# Patient Record
Sex: Female | Born: 1960 | Race: White | Hispanic: No | Marital: Married | Smoking: Never smoker
Health system: Southern US, Community
[De-identification: ages and names within clinical notes are randomized; demographics above are authoritative.]

## PROBLEM LIST (undated history)

## (undated) DIAGNOSIS — E785 Hyperlipidemia, unspecified: Secondary | ICD-10-CM

## (undated) HISTORY — DX: Hyperlipidemia, unspecified: E78.5

---

## 1988-03-19 HISTORY — PX: TUMOR REMOVAL: SHX12

## 1998-06-24 ENCOUNTER — Other Ambulatory Visit: Admission: RE | Admit: 1998-06-24 | Discharge: 1998-06-24 | Payer: Self-pay | Admitting: *Deleted

## 1999-11-07 ENCOUNTER — Other Ambulatory Visit: Admission: RE | Admit: 1999-11-07 | Discharge: 1999-11-07 | Payer: Self-pay | Admitting: *Deleted

## 2005-09-25 ENCOUNTER — Encounter: Payer: Self-pay | Admitting: Internal Medicine

## 2005-10-02 ENCOUNTER — Encounter: Payer: Self-pay | Admitting: Internal Medicine

## 2005-10-11 ENCOUNTER — Encounter: Payer: Self-pay | Admitting: Internal Medicine

## 2007-04-30 ENCOUNTER — Encounter: Admission: RE | Admit: 2007-04-30 | Discharge: 2007-04-30 | Payer: Self-pay | Admitting: Obstetrics and Gynecology

## 2007-05-20 ENCOUNTER — Other Ambulatory Visit: Admission: RE | Admit: 2007-05-20 | Discharge: 2007-05-20 | Payer: Self-pay | Admitting: Interventional Radiology

## 2007-05-20 ENCOUNTER — Encounter: Admission: RE | Admit: 2007-05-20 | Discharge: 2007-05-20 | Payer: Self-pay | Admitting: Obstetrics and Gynecology

## 2007-05-20 ENCOUNTER — Encounter (INDEPENDENT_AMBULATORY_CARE_PROVIDER_SITE_OTHER): Payer: Self-pay | Admitting: Interventional Radiology

## 2007-08-20 ENCOUNTER — Ambulatory Visit: Payer: Self-pay | Admitting: Internal Medicine

## 2007-08-20 DIAGNOSIS — E041 Nontoxic single thyroid nodule: Secondary | ICD-10-CM

## 2007-08-20 DIAGNOSIS — E785 Hyperlipidemia, unspecified: Secondary | ICD-10-CM

## 2007-12-18 LAB — CONVERTED CEMR LAB: Pap Smear: NORMAL

## 2008-02-13 ENCOUNTER — Ambulatory Visit: Payer: Self-pay | Admitting: Internal Medicine

## 2008-02-13 LAB — CONVERTED CEMR LAB
AST: 23 units/L (ref 0–37)
Alkaline Phosphatase: 47 units/L (ref 39–117)
Bilirubin Urine: NEGATIVE
Bilirubin, Direct: 0.1 mg/dL (ref 0.0–0.3)
Chloride: 108 meq/L (ref 96–112)
Cholesterol: 232 mg/dL (ref 0–200)
Eosinophils Absolute: 0 10*3/uL (ref 0.0–0.7)
Eosinophils Relative: 0.9 % (ref 0.0–5.0)
GFR calc Af Amer: 98 mL/min
GFR calc non Af Amer: 81 mL/min
Glucose, Bld: 74 mg/dL (ref 70–99)
HCT: 45.2 % (ref 36.0–46.0)
HDL: 78.7 mg/dL (ref >39.0)
Monocytes Absolute: 0.4 10*3/uL (ref 0.1–1.0)
Monocytes Relative: 9.2 % (ref 3.0–12.0)
Neutrophils Relative %: 56 % (ref 43.0–77.0)
Nitrite: NEGATIVE
Platelets: 209 10*3/uL (ref 150–400)
Potassium: 4.5 meq/L (ref 3.5–5.1)
Protein, U semiquant: NEGATIVE
RDW: 12.1 % (ref 11.5–14.6)
Sodium: 144 meq/L (ref 135–145)
Total CHOL/HDL Ratio: 2.9
Triglycerides: 81 mg/dL (ref 0–149)
Urobilinogen, UA: 0.2
WBC: 4.3 10*3/uL — ABNORMAL LOW (ref 4.5–10.5)

## 2008-02-19 ENCOUNTER — Ambulatory Visit: Payer: Self-pay | Admitting: Internal Medicine

## 2008-03-23 ENCOUNTER — Ambulatory Visit: Payer: Self-pay | Admitting: Family Medicine

## 2008-04-28 ENCOUNTER — Telehealth: Payer: Self-pay | Admitting: Internal Medicine

## 2008-05-05 ENCOUNTER — Ambulatory Visit: Payer: Self-pay | Admitting: Gastroenterology

## 2008-05-27 ENCOUNTER — Ambulatory Visit: Payer: Self-pay | Admitting: Gastroenterology

## 2008-08-17 ENCOUNTER — Encounter: Payer: Self-pay | Admitting: Internal Medicine

## 2008-08-25 ENCOUNTER — Encounter: Admission: RE | Admit: 2008-08-25 | Discharge: 2008-08-25 | Payer: Self-pay | Admitting: Endocrinology

## 2008-11-15 ENCOUNTER — Telehealth (INDEPENDENT_AMBULATORY_CARE_PROVIDER_SITE_OTHER): Payer: Self-pay | Admitting: *Deleted

## 2009-01-18 ENCOUNTER — Ambulatory Visit: Payer: Self-pay | Admitting: Family Medicine

## 2009-01-19 ENCOUNTER — Telehealth: Payer: Self-pay | Admitting: Family Medicine

## 2009-07-17 LAB — CONVERTED CEMR LAB: Pap Smear: NORMAL

## 2009-08-05 ENCOUNTER — Ambulatory Visit: Payer: Self-pay | Admitting: Internal Medicine

## 2009-08-05 LAB — CONVERTED CEMR LAB
AST: 19 units/L (ref 0–37)
Albumin: 4.3 g/dL (ref 3.5–5.2)
Basophils Absolute: 0 10*3/uL (ref 0.0–0.1)
Basophils Relative: 0.3 % (ref 0.0–3.0)
CO2: 29 meq/L (ref 19–32)
Cholesterol: 224 mg/dL — ABNORMAL HIGH (ref 0–200)
Eosinophils Absolute: 0 10*3/uL (ref 0.0–0.7)
Glucose, Bld: 81 mg/dL (ref 70–99)
HCT: 43.7 % (ref 36.0–46.0)
HDL: 75.5 mg/dL (ref >39.00)
Hemoglobin: 14.9 g/dL (ref 12.0–15.0)
Lymphs Abs: 1.4 10*3/uL (ref 0.7–4.0)
MCHC: 34.2 g/dL (ref 30.0–36.0)
Neutro Abs: 4.6 10*3/uL (ref 1.4–7.7)
RBC: 4.73 M/uL (ref 3.87–5.11)
RDW: 13.4 % (ref 11.5–14.6)
TSH: 1.17 microintl units/mL (ref 0.35–5.50)
Total CHOL/HDL Ratio: 3
Total Protein: 6.5 g/dL (ref 6.0–8.3)
Triglycerides: 117 mg/dL (ref 0.0–149.0)
VLDL: 23.4 mg/dL (ref 0.0–40.0)

## 2009-08-17 ENCOUNTER — Encounter: Payer: Self-pay | Admitting: Internal Medicine

## 2009-08-23 ENCOUNTER — Ambulatory Visit: Payer: Self-pay | Admitting: Internal Medicine

## 2009-08-25 ENCOUNTER — Ambulatory Visit: Payer: Self-pay | Admitting: Internal Medicine

## 2009-08-26 ENCOUNTER — Encounter: Payer: Self-pay | Admitting: Internal Medicine

## 2009-08-26 DIAGNOSIS — R319 Hematuria, unspecified: Secondary | ICD-10-CM | POA: Insufficient documentation

## 2009-08-26 LAB — CONVERTED CEMR LAB
Glucose, Urine, Semiquant: NEGATIVE
Nitrite: NEGATIVE
Protein, U semiquant: NEGATIVE
WBC Urine, dipstick: NEGATIVE
pH: 5.5

## 2009-08-29 ENCOUNTER — Ambulatory Visit: Payer: Self-pay | Admitting: Cardiovascular Disease

## 2009-09-29 ENCOUNTER — Encounter: Payer: Self-pay | Admitting: Internal Medicine

## 2009-11-30 ENCOUNTER — Ambulatory Visit (HOSPITAL_COMMUNITY): Admission: RE | Admit: 2009-11-30 | Discharge: 2009-11-30 | Payer: Self-pay | Admitting: Obstetrics and Gynecology

## 2010-01-24 ENCOUNTER — Encounter: Admission: RE | Admit: 2010-01-24 | Discharge: 2010-01-24 | Payer: Self-pay | Admitting: Endocrinology

## 2010-03-19 LAB — HM PAP SMEAR: HM Pap smear: NORMAL

## 2010-04-20 NOTE — Assessment & Plan Note (Signed)
Summary: cpx/cjr/pt rescd from bump//ccm   Vital Signs:  Patient profile:   50 year old female Menstrual status:  regular LMP:     08/05/2009 Height:      65.5 inches Weight:      169 pounds BMI:     27.80 Pulse rate:   64 / minute Pulse rhythm:   regular Resp:     12 per minute BP sitting:   122 / 74  (left arm) Cuff size:   regular  Vitals Entered By: Gladis Riffle, RN (August 23, 2009 9:12 AM) CC: cpx, labs done has gyn Is Patient Diabetic? No LMP (date): 08/05/2009     Menstrual Status regular Enter LMP: 08/05/2009 Last PAP Result normal   CC:  cpx and labs done has gyn.  History of Present Illness: cpx  Preventive Screening-Counseling & Management  Alcohol-Tobacco     Smoking Status: never  Current Problems (verified): 1)  Preventive Health Care  (ICD-V70.0) 2)  Thyroid Nodule  (ICD-241.0) 3)  Hyperlipidemia  (ICD-272.4)  Current Medications (verified): 1)  Multivitamins   Tabs (Multiple Vitamin) .... Once Daily 2)  Fish Oil 1000 Mg  Caps (Omega-3 Fatty Acids) .... Once Daily 3)  Caltrate 600+d 600-400 Mg-Unit  Tabs (Calcium Carbonate-Vitamin D) .... Two Times A Day 4)  Aspirin 81 Mg  Tbec (Aspirin) .... Once Daily  Allergies: 1)  ! 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   Head:  normocephalic and atraumatic.   Eyes:  pupils equal and pupils round.   Ears:  R ear normal and L ear normal.   Nose:  no external deformity and no external erythema.   Neck:  No deformities, masses, or tenderness noted. Chest Wall:  No deformities, masses, or tenderness noted. Lungs:  normal respiratory effort and no intercostal retractions.   Heart:  normal rate and regular rhythm.   Abdomen:  Bowel sounds positive,abdomen soft and non-tender without masses, organomegaly or hernias noted. Msk:  No deformity or scoliosis noted of thoracic or lumbar spine.   Neurologic:  cranial nerves II-XII intact and gait normal.   Skin:  Intact without suspicious lesions or rashes Cervical Nodes:  no anterior cervical adenopathy and no posterior cervical adenopathy.   Psych:  memory intact for recent and remote and good eye contact.     Impression & Recommendations:  Problem # 1:  PREVENTIVE HEALTH CARE (ICD-V70.0) Health Maint utd advised exercise/ modest weight loss low fat/low calorie diet  Problem # 2:  HYPERLIPIDEMIA (ICD-272.4) NOTE hdl no treatment necessary  Complete Medication List: 1)  Multivitamins Tabs (Multiple vitamin) .... Once daily 2)  Fish Oil 1000 Mg Caps (Omega-3 fatty acids) .... Once daily 3)  Caltrate 600+d 600-400 Mg-unit Tabs (Calcium carbonate-vitamin d) .... Two times a day 4)  Aspirin 81 Mg Tbec (Aspirin) .... Once daily  Preventive Care Screening  Mammogram:    Date:  08/17/2009    Next Due:  08/2011    Results:  normal-pts report   Pap Smear:    Date:  07/17/2009    Next Due:  07/2012    Results:  normal   Appended Document: cpx/cjr/pt rescd from bump//ccm  Laboratory Results   Urine Tests    Routine Urinalysis   Color: yellow Appearance: Clear Glucose: negative   (Normal Range: Negative) Bilirubin: negative   (Normal Range: Negative) Ketone: negative   (Normal Range: Negative) Spec. Gravity: >=1.030   (Normal  Range: 1.003-1.035) Blood: 1+   (Normal Range: Negative) pH: 5.5   (Normal Range: 5.0-8.0) Protein: negative   (Normal Range: Negative) Urobilinogen: 0.2   (Normal Range: 0-1) Nitrite: negative   (Normal Range: Negative) Leukocyte Esterace: negative   (Normal Range: Negative)    Comments: Rita Ohara  August 23, 2009 11:35 AM      Appended Document: cpx/cjr/pt rescd from bump//ccm call patient. minimal amount of blood in urine repeat UA with culture  Appended Document: cpx/cjr/pt rescd from bump//ccm Patient notified. Appt made for 08/25/09

## 2010-04-20 NOTE — Consult Note (Signed)
Summary: [facility]  [facility]   Imported By: Maryln Gottron 10/07/2009 13:15:25  _____________________________________________________________________  External Attachment:    Type:   Image     Comment:   External Document

## 2010-04-20 NOTE — Miscellaneous (Signed)
Summary: Orders Update  Clinical Lists Changes  Problems: Added new problem of HEMATURIA UNSPECIFIED (ICD-599.70) Orders: Added new Referral order of Radiology Referral (Radiology) - Signed

## 2010-05-19 NOTE — Op Note (Signed)
  [name], Brandy Proctor               ACCOUNT NO.:  [id number]  MEDICAL RECORD NO.:  [id number]          PATIENT TYPE:  AMB  LOCATION:  SDC                           FACILITY:  [facility]  PHYSICIAN:  Sherry A. Dickstein, M.D.DATE OF BIRTH:  [birth year]  DATE OF PROCEDURE: DATE OF DISCHARGE:                              OPERATIVE REPORT  PREOPERATIVE DIAGNOSES:  Menorrhagia, submucosal fibroids.  POSTOPERATIVE DIAGNOSES:  Menorrhagia, submucosal fibroids.  PROCEDURES:  Dilation and curettage, hysteroscopy with MyoSure resection.  SURGEON:  Sherry A. Rosalio Macadamia, MD  ANESTHESIA:  General.  INDICATIONS:  This is a 50 year old, G4, P3-0-1-3 woman whose menstrual cycles every 28 days lasting 5-6 days with excessively heavy flow. Because of this menorrhagia, the patient was evaluated first using ultrasound and then with a sonohysterogram.  The sonohysterogram revealed a submucosal fibroid approximately 2 x 2 cm with a possible second fibroid as well.  Because of the submucosal fibroid, the patient is brought to the operating room for D and C, hysteroscopy with MyoSure resection.  FINDINGS:  Normal sized anteflexed uterus with submucosal fibroid present.  PROCEDURE:  The patient was brought into the operating room and given adequate general anesthesia.  She was placed in dorsal lithotomy position.  Her perineum was washed with Betadine and vagina was washed with Betadine.  Pelvic examination was performed.  Surgeon's gown and gloves were changed.  The patient was draped in sterile fashion. Paracervical block was administered with 1% Nesacaine.  Single-toothed tenaculum was placed on the anterior lip of the cervix using Pitressin that was diluted 20 mL in 50 mL of saline.  Approximately, 10 mL of Pitressin was infiltrated directly into the cervix.  The cervix was then dilated using Pratt dilators up to a #21.  The hysteroscope was introduced into the endometrial cavity and pictures were  obtained.  A submucosal fibroid was visualized after both corners were visualized. Using the MyoSure resector, the submucosal fibroid was dissected in its entirety.  Once adequate hemostasis was present, pictures were obtained before and after this procedure.  All instruments were removed from the vagina.  Adequate hemostasis was present.  The patient was taken out of the dorsal lithotomy position.  The LMA breathing tube was removed.  She was moved from the operating table to a stretcher in stable condition. Complications were none.  Estimated blood loss approximately 10 mL.  The specimen was sent to Pathology, this approximately 345 saline differential.     Cordelia Pen A. Rosalio Macadamia, M.D.    SAD/MEDQ  D:  11/30/2009  T:  12/01/2009  Job:  [id number]  Electronically Signed by Floyde Parkins M.D. on 05/19/2010 12:32:34 PM

## 2010-06-01 LAB — CBC
HCT: 45.5 % (ref 36.0–46.0)
Hemoglobin: 15.3 g/dL — ABNORMAL HIGH (ref 12.0–15.0)
MCH: 30.8 pg (ref 26.0–34.0)
MCHC: 33.6 g/dL (ref 30.0–36.0)
RDW: 13.3 % (ref 11.5–15.5)

## 2010-08-28 ENCOUNTER — Encounter: Payer: Self-pay | Admitting: Internal Medicine

## 2010-08-29 ENCOUNTER — Other Ambulatory Visit (INDEPENDENT_AMBULATORY_CARE_PROVIDER_SITE_OTHER): Payer: BLUE CROSS/BLUE SHIELD

## 2010-08-29 DIAGNOSIS — Z0000 Encounter for general adult medical examination without abnormal findings: Secondary | ICD-10-CM

## 2010-08-29 LAB — BASIC METABOLIC PANEL
BUN: 16 mg/dL (ref 6–23)
Calcium: 9.5 mg/dL (ref 8.4–10.5)
Creatinine, Ser: 0.7 mg/dL (ref 0.4–1.2)
GFR: 88.87 mL/min (ref >60.00)
Glucose, Bld: 89 mg/dL (ref 70–99)
Potassium: 5 mEq/L (ref 3.5–5.1)

## 2010-08-29 LAB — CBC WITH DIFFERENTIAL/PLATELET
Basophils Absolute: 0 10*3/uL (ref 0.0–0.1)
Eosinophils Relative: 0.4 % (ref 0.0–5.0)
HCT: 44.7 % (ref 36.0–46.0)
Lymphocytes Relative: 25.7 % (ref 12.0–46.0)
Lymphs Abs: 1.4 10*3/uL (ref 0.7–4.0)
Monocytes Relative: 8.1 % (ref 3.0–12.0)
Neutrophils Relative %: 65.5 % (ref 43.0–77.0)
Platelets: 224 10*3/uL (ref 150.0–400.0)
RDW: 13.4 % (ref 11.5–14.6)
WBC: 5.6 10*3/uL (ref 4.5–10.5)

## 2010-08-29 LAB — POCT URINALYSIS DIPSTICK
Bilirubin, UA: NEGATIVE
Glucose, UA: NEGATIVE
Leukocytes, UA: NEGATIVE
Nitrite, UA: NEGATIVE
Urobilinogen, UA: 0.2
pH, UA: 5

## 2010-08-29 LAB — LIPID PANEL
Cholesterol: 249 mg/dL — ABNORMAL HIGH (ref 0–200)
Total CHOL/HDL Ratio: 4
VLDL: 18 mg/dL (ref 0.0–40.0)

## 2010-08-29 LAB — HEPATIC FUNCTION PANEL
ALT: 10 U/L (ref 0–35)
AST: 17 U/L (ref 0–37)
Bilirubin, Direct: 0.1 mg/dL (ref 0.0–0.3)
Total Bilirubin: 0.4 mg/dL (ref 0.3–1.2)

## 2010-08-29 LAB — LDL CHOLESTEROL, DIRECT: Direct LDL: 156.3 mg/dL

## 2010-08-30 ENCOUNTER — Encounter: Payer: Self-pay | Admitting: Internal Medicine

## 2010-08-31 ENCOUNTER — Telehealth: Payer: Self-pay | Admitting: Internal Medicine

## 2010-09-04 ENCOUNTER — Encounter: Payer: Self-pay | Admitting: Internal Medicine

## 2010-09-04 ENCOUNTER — Ambulatory Visit (INDEPENDENT_AMBULATORY_CARE_PROVIDER_SITE_OTHER): Payer: BLUE CROSS/BLUE SHIELD | Admitting: Internal Medicine

## 2010-09-04 DIAGNOSIS — E041 Nontoxic single thyroid nodule: Secondary | ICD-10-CM

## 2010-09-04 NOTE — Progress Notes (Signed)
  Subjective:    Patient ID: Brandy Proctor, female    DOB: 1960/12/13, 50 y.o.   MRN: [id number]  HPI  cpx Past Medical History  Diagnosis Date  . Hyperlipidemia    Past Surgical History  Procedure Date  . Cesarean section     x3  . 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 She does not have any smokeless tobacco history on file. Her alcohol and drug histories not on file. family history includes Heart disease in her brother and father and Hyperlipidemia in her father and mother. Allergies  Allergen Reactions  . Penicillins     REACTION: rash     Review of Systems  patient denies chest pain, shortness of breath, orthopnea. Denies lower extremity edema, abdominal pain, change in appetite, change in bowel movements. Patient denies rashes, musculoskeletal complaints. No other specific complaints in a complete review of systems.      Objective:   Physical Exam  Well-developed well-nourished female in no acute distress. HEENT exam atraumatic, normocephalic, extraocular muscles are intact. Neck is supple. No jugular venous distention no thyromegaly. Chest clear to auscultation without increased work of breathing. Cardiac exam S1 and S2 are regular. Abdominal exam active bowel sounds, soft, nontender. Extremities no edema. Neurologic exam she is alert without any motor sensory deficits. Gait is normal.      Assessment & Plan:  Well visit---health maint UTD Recommended modest weight loss and daily exercise

## 2010-09-04 NOTE — Assessment & Plan Note (Signed)
Started levothyroxine for enlarging thyroid nodule

## 2010-09-15 ENCOUNTER — Encounter: Payer: Self-pay | Admitting: Internal Medicine

## 2010-11-30 ENCOUNTER — Other Ambulatory Visit: Payer: Self-pay | Admitting: Endocrinology

## 2010-11-30 DIAGNOSIS — E049 Nontoxic goiter, unspecified: Secondary | ICD-10-CM

## 2010-12-12 ENCOUNTER — Ambulatory Visit
Admission: RE | Admit: 2010-12-12 | Discharge: 2010-12-12 | Disposition: A | Discharge status: Home / Self Care | Payer: BLUE CROSS/BLUE SHIELD | Source: Ambulatory Visit | Attending: Endocrinology | Admitting: Endocrinology

## 2010-12-12 DIAGNOSIS — E049 Nontoxic goiter, unspecified: Secondary | ICD-10-CM

## 2010-12-22 NOTE — Telephone Encounter (Signed)
Chart opened in error

## 2011-01-04 ENCOUNTER — Encounter: Payer: Self-pay | Admitting: Internal Medicine

## 2011-01-04 ENCOUNTER — Ambulatory Visit (INDEPENDENT_AMBULATORY_CARE_PROVIDER_SITE_OTHER): Payer: BC Managed Care – PPO | Admitting: Internal Medicine

## 2011-01-04 VITALS — BP 122/84 | Temp 98.5°F | Wt 165.0 lb

## 2011-01-04 DIAGNOSIS — M25569 Pain in unspecified knee: Secondary | ICD-10-CM

## 2011-01-07 NOTE — Progress Notes (Signed)
  Subjective:    Patient ID: Brandy Proctor, female    DOB: Feb 14, 1961, 50 y.o.   MRN: [id number]  HPI  Patient comes in with right knee pain. This is been ongoing for one to 2 weeks. She notes some swelling behind her right knee. This seems to be intermittent. She denies any erythema around the knee. No fevers or chills. She denies any acute trauma.  Past Medical History  Diagnosis Date  . Hyperlipidemia      Review of Systems  patient denies chest pain, shortness of breath, orthopnea. Denies lower extremity edema, abdominal pain, change in appetite, change in bowel movements. Patient denies rashes, musculoskeletal complaints. No other specific complaints in a complete review of systems.      Objective:   Physical Exam Well-developed female. Chest clear to auscultation cardiac exam S1-S2 are regular. She has full range of motion of both knees. She may have a very small joint effusion on the right with a positive bulge sign.    Assessment & Plan:  Right knee pain. Unclear etiology. We'll check an x-ray. We'll make decisions after that. I've asked her to ice the area after prolonged walking.

## 2011-01-08 ENCOUNTER — Ambulatory Visit (INDEPENDENT_AMBULATORY_CARE_PROVIDER_SITE_OTHER)
Admission: RE | Admit: 2011-01-08 | Discharge: 2011-01-08 | Disposition: A | Discharge status: Home / Self Care | Payer: BC Managed Care – PPO | Source: Ambulatory Visit | Attending: Internal Medicine | Admitting: Internal Medicine

## 2011-01-08 DIAGNOSIS — M25569 Pain in unspecified knee: Secondary | ICD-10-CM

## 2011-01-09 ENCOUNTER — Other Ambulatory Visit: Payer: Self-pay | Admitting: Internal Medicine

## 2011-01-09 DIAGNOSIS — M25561 Pain in right knee: Secondary | ICD-10-CM

## 2011-03-20 LAB — HM PAP SMEAR: HM Pap smear: NORMAL

## 2011-09-25 ENCOUNTER — Other Ambulatory Visit: Payer: BC Managed Care – PPO

## 2011-10-04 ENCOUNTER — Encounter: Payer: BC Managed Care – PPO | Admitting: Internal Medicine

## 2011-10-04 ENCOUNTER — Other Ambulatory Visit: Payer: Self-pay | Admitting: Obstetrics

## 2011-10-04 DIAGNOSIS — Z1231 Encounter for screening mammogram for malignant neoplasm of breast: Secondary | ICD-10-CM

## 2011-10-11 ENCOUNTER — Encounter: Payer: Self-pay | Admitting: Internal Medicine

## 2011-10-11 ENCOUNTER — Ambulatory Visit (INDEPENDENT_AMBULATORY_CARE_PROVIDER_SITE_OTHER): Payer: BC Managed Care – PPO | Admitting: Internal Medicine

## 2011-10-11 ENCOUNTER — Other Ambulatory Visit: Payer: BC Managed Care – PPO

## 2011-10-11 VITALS — BP 134/90 | HR 72 | Temp 98.6°F | Ht 65.5 in | Wt 171.0 lb

## 2011-10-11 DIAGNOSIS — Z0000 Encounter for general adult medical examination without abnormal findings: Secondary | ICD-10-CM

## 2011-10-11 LAB — CBC WITH DIFFERENTIAL/PLATELET
Basophils Absolute: 0 10*3/uL (ref 0.0–0.1)
Eosinophils Absolute: 0 10*3/uL (ref 0.0–0.7)
HCT: 44.7 % (ref 36.0–46.0)
Hemoglobin: 15.1 g/dL — ABNORMAL HIGH (ref 12.0–15.0)
Lymphs Abs: 1.2 10*3/uL (ref 0.7–4.0)
MCHC: 33.8 g/dL (ref 30.0–36.0)
Monocytes Absolute: 0.4 10*3/uL (ref 0.1–1.0)
Neutro Abs: 2.2 10*3/uL (ref 1.4–7.7)
Platelets: 227 10*3/uL (ref 150.0–400.0)
RDW: 12.9 % (ref 11.5–14.6)

## 2011-10-11 LAB — HEPATIC FUNCTION PANEL
ALT: 16 U/L (ref 0–35)
AST: 27 U/L (ref 0–37)
Albumin: 4.4 g/dL (ref 3.5–5.2)
Total Bilirubin: 0.6 mg/dL (ref 0.3–1.2)

## 2011-10-11 LAB — BASIC METABOLIC PANEL
CO2: 28 mEq/L (ref 19–32)
Calcium: 9.4 mg/dL (ref 8.4–10.5)
Creatinine, Ser: 0.8 mg/dL (ref 0.4–1.2)
GFR: 83.21 mL/min (ref >60.00)
Sodium: 138 mEq/L (ref 135–145)

## 2011-10-11 LAB — LIPID PANEL
Cholesterol: 275 mg/dL — ABNORMAL HIGH (ref 0–200)
Total CHOL/HDL Ratio: 4
Triglycerides: 97 mg/dL (ref 0.0–149.0)

## 2011-10-11 LAB — POCT URINALYSIS DIPSTICK
Bilirubin, UA: NEGATIVE
Leukocytes, UA: NEGATIVE
Nitrite, UA: NEGATIVE
Protein, UA: NEGATIVE
Urobilinogen, UA: 0.2
pH, UA: 6

## 2011-10-11 NOTE — Progress Notes (Signed)
Patient ID: Brandy Proctor, female   DOB: [birth year], 51 y.o.   MRN: [id number]  CPX  Past Medical History  Diagnosis Date  . Hyperlipidemia     History   Social History  . Marital Status: Widowed    Spouse Name: N/A    Number of Children: N/A  . Years of Education: N/A   Occupational History  . Not on file.   Social History Main Topics  . Smoking status: Never Smoker   . Smokeless tobacco: Not on file  . Alcohol Use:   . Drug Use:   . Sexually Active:    Other Topics Concern  . Not on file   Social History Narrative  . No narrative on file    Past Surgical History  Procedure Date  . Cesarean section     x3  . 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 Hyperlipidemia Mother   . Heart disease Father     CABG  . Hyperlipidemia Father   . Heart disease Brother     Allergies  Allergen Reactions  . Penicillins     REACTION: rash    Current Outpatient Prescriptions on File Prior to Visit  Medication Sig Dispense Refill  . aspirin 81 MG tablet Take 81 mg by mouth daily.        . Calcium Carbonate-Vit D-Min (CALTRATE 600+D PLUS) 600-400 MG-UNIT per tablet Take 1 tablet by mouth daily.        . fish oil-omega-3 fatty acids 1000 MG capsule Take 2 g by mouth daily.        . folic acid (FOLVITE) 1 MG tablet Take 1 tablet by mouth daily.      Marland Kitchen levothyroxine (SYNTHROID, LEVOTHROID) 25 MCG tablet Take 1 tablet by mouth daily.      . Multiple Vitamin (MULTIVITAMIN) tablet Take 1 tablet by mouth daily.           patient denies chest pain, shortness of breath, orthopnea. Denies lower extremity edema, abdominal pain, change in appetite, change in bowel movements. Patient denies rashes, musculoskeletal complaints. No other specific complaints in a complete review of systems.   BP 134/90  Pulse 72  Temp 98.6 F (37 C) (Oral)  Ht 5' 5.5" (1.664 m)  Wt 171 lb (77.565 kg)  BMI 28.02 kg/m2  Well-developed  well-nourished female in no acute distress. HEENT exam atraumatic, normocephalic, extraocular muscles are intact. Neck is supple. No jugular venous distention no thyromegaly. Chest clear to auscultation without increased work of breathing. Cardiac exam S1 and S2 are regular. Abdominal exam active bowel sounds, soft, nontender. Extremities no edema. Neurologic exam she is alert without any motor sensory deficits. Gait is normal.   Well visit- health maint utd-- advised aggressive weight loss

## 2011-10-12 ENCOUNTER — Other Ambulatory Visit: Payer: Self-pay | Admitting: Internal Medicine

## 2011-10-12 DIAGNOSIS — E7800 Pure hypercholesterolemia, unspecified: Secondary | ICD-10-CM

## 2011-10-12 NOTE — Progress Notes (Signed)
Quick Note:  Spoke with Brandy Proctor- informed of lab and dr. Marliss Coots instructions - future order done and transferred to scheduling for lab appt - fasting ______

## 2011-10-17 ENCOUNTER — Encounter: Payer: BC Managed Care – PPO | Admitting: Internal Medicine

## 2011-10-17 ENCOUNTER — Ambulatory Visit
Admission: RE | Admit: 2011-10-17 | Discharge: 2011-10-17 | Disposition: A | Discharge status: Home / Self Care | Payer: BC Managed Care – PPO | Source: Ambulatory Visit | Attending: Obstetrics | Admitting: Obstetrics

## 2011-10-17 DIAGNOSIS — Z1231 Encounter for screening mammogram for malignant neoplasm of breast: Secondary | ICD-10-CM

## 2011-11-09 ENCOUNTER — Other Ambulatory Visit: Payer: Self-pay | Admitting: Endocrinology

## 2011-11-09 DIAGNOSIS — E049 Nontoxic goiter, unspecified: Secondary | ICD-10-CM

## 2011-11-13 ENCOUNTER — Ambulatory Visit
Admission: RE | Admit: 2011-11-13 | Discharge: 2011-11-13 | Disposition: A | Discharge status: Home / Self Care | Payer: BC Managed Care – PPO | Source: Ambulatory Visit | Attending: Endocrinology | Admitting: Endocrinology

## 2011-11-13 DIAGNOSIS — E049 Nontoxic goiter, unspecified: Secondary | ICD-10-CM

## 2012-01-11 ENCOUNTER — Other Ambulatory Visit: Payer: BC Managed Care – PPO

## 2012-01-23 ENCOUNTER — Other Ambulatory Visit (INDEPENDENT_AMBULATORY_CARE_PROVIDER_SITE_OTHER): Payer: BC Managed Care – PPO

## 2012-01-23 DIAGNOSIS — E7800 Pure hypercholesterolemia, unspecified: Secondary | ICD-10-CM

## 2012-01-23 LAB — LIPID PANEL
Cholesterol: 271 mg/dL — ABNORMAL HIGH (ref 0–200)
Total CHOL/HDL Ratio: 4
Triglycerides: 120 mg/dL (ref 0.0–149.0)
VLDL: 24 mg/dL (ref 0.0–40.0)

## 2012-01-30 ENCOUNTER — Telehealth: Payer: Self-pay | Admitting: Internal Medicine

## 2012-01-30 NOTE — Telephone Encounter (Signed)
Pt called req lab results. Pls call pt at work #.

## 2012-01-31 NOTE — Telephone Encounter (Signed)
Pt called to get lab results. Pls call asap.

## 2012-02-01 NOTE — Telephone Encounter (Signed)
Lipid Panel     Component Value Date/Time   CHOL 271* 01/23/2012 1005   TRIG 120.0 01/23/2012 1005   HDL 63.80 01/23/2012 1005   CHOLHDL 4 01/23/2012 1005   VLDL 24.0 01/23/2012 1005   Call patient- cholesterol too high- start lipitor 10 mg po qhs, #90/3 refills. Check liver and lipid panel in 8 weeks

## 2012-02-01 NOTE — Telephone Encounter (Signed)
Pt had labs at work and labs were normal.  Pt is going to fax her labs over before starting lipitor for Dr Cato Mulligan to review

## 2012-02-01 NOTE — Telephone Encounter (Signed)
Left message for pt to call back  °

## 2012-02-06 ENCOUNTER — Telehealth: Payer: Self-pay | Admitting: Internal Medicine

## 2012-02-06 NOTE — Telephone Encounter (Signed)
Pt is calling to talk with cindy concerning blood work results she faxed ?yesterday. Pt would like to know whether MD is going to put her on chole med.

## 2012-02-07 NOTE — Telephone Encounter (Signed)
Pt called to follow up again. Pls call anytime EXCEPT 3:30-4:00pm  [phone]

## 2012-02-08 MED ORDER — ATORVASTATIN CALCIUM 10 MG PO TABS: 10.0000 mg | ORAL_TABLET | Freq: Every day | ORAL | Status: DC

## 2012-02-08 NOTE — Telephone Encounter (Signed)
Pt aware of results, medicine sent in electronically to [facility].  I also mailed an activation code for pt to get set up in [place]

## 2012-03-31 ENCOUNTER — Telehealth: Payer: Self-pay | Admitting: Internal Medicine

## 2012-03-31 NOTE — Telephone Encounter (Signed)
Pt aware/appt set/kjh

## 2012-03-31 NOTE — Telephone Encounter (Signed)
Pt states she is supposed to follow up in a month for lipid panel. She was here a month last month for the same thing. Is it ok to schedule this lab? Thank you.

## 2012-03-31 NOTE — Telephone Encounter (Signed)
Pt had this done 01/23/12

## 2012-03-31 NOTE — Telephone Encounter (Signed)
Yes/  Please repeat

## 2012-04-10 ENCOUNTER — Other Ambulatory Visit: Payer: BC Managed Care – PPO

## 2012-04-17 ENCOUNTER — Other Ambulatory Visit (INDEPENDENT_AMBULATORY_CARE_PROVIDER_SITE_OTHER): Payer: BC Managed Care – PPO

## 2012-04-17 DIAGNOSIS — E785 Hyperlipidemia, unspecified: Secondary | ICD-10-CM

## 2012-04-17 LAB — LIPID PANEL
HDL: 76.2 mg/dL (ref >39.00)
Total CHOL/HDL Ratio: 3

## 2012-05-03 ENCOUNTER — Other Ambulatory Visit: Payer: Self-pay

## 2012-10-22 ENCOUNTER — Other Ambulatory Visit: Payer: Self-pay

## 2012-10-22 ENCOUNTER — Encounter: Payer: Self-pay | Admitting: Internal Medicine

## 2012-10-22 ENCOUNTER — Telehealth: Payer: Self-pay | Admitting: Internal Medicine

## 2012-10-22 DIAGNOSIS — Z1231 Encounter for screening mammogram for malignant neoplasm of breast: Secondary | ICD-10-CM

## 2012-10-22 NOTE — Telephone Encounter (Signed)
OK 

## 2012-10-22 NOTE — Telephone Encounter (Signed)
Ok per Dr Swords 

## 2012-10-22 NOTE — Telephone Encounter (Signed)
Pt needs CPX prior to Oct 1.Dr Cato Mulligan,  Is it ok if she sees Adline Mango for that CPX? Ms Orvan Falconer, do you mind seeing this pt for a cpx?

## 2012-11-05 ENCOUNTER — Ambulatory Visit: Payer: BC Managed Care – PPO

## 2012-11-10 ENCOUNTER — Other Ambulatory Visit (INDEPENDENT_AMBULATORY_CARE_PROVIDER_SITE_OTHER): Payer: BC Managed Care – PPO

## 2012-11-10 DIAGNOSIS — Z0000 Encounter for general adult medical examination without abnormal findings: Secondary | ICD-10-CM

## 2012-11-10 LAB — HEPATIC FUNCTION PANEL
ALT: 37 U/L — ABNORMAL HIGH (ref 0–35)
AST: 54 U/L — ABNORMAL HIGH (ref 0–37)
Albumin: 4.4 g/dL (ref 3.5–5.2)
Alkaline Phosphatase: 59 U/L (ref 39–117)
Total Protein: 7.2 g/dL (ref 6.0–8.3)

## 2012-11-10 LAB — CBC WITH DIFFERENTIAL/PLATELET
Basophils Relative: 0.1 % (ref 0.0–3.0)
Eosinophils Absolute: 0.1 10*3/uL (ref 0.0–0.7)
Eosinophils Relative: 1.1 % (ref 0.0–5.0)
Hemoglobin: 14 g/dL (ref 12.0–15.0)
Lymphocytes Relative: 30 % (ref 12.0–46.0)
MCHC: 34 g/dL (ref 30.0–36.0)
Monocytes Relative: 7.9 % (ref 3.0–12.0)
Neutro Abs: 2.8 10*3/uL (ref 1.4–7.7)
RBC: 4.55 Mil/uL (ref 3.87–5.11)
WBC: 4.6 10*3/uL (ref 4.5–10.5)

## 2012-11-10 LAB — LIPID PANEL
LDL Cholesterol: 104 mg/dL — ABNORMAL HIGH (ref 0–99)
Total CHOL/HDL Ratio: 3
VLDL: 26.6 mg/dL (ref 0.0–40.0)

## 2012-11-10 LAB — BASIC METABOLIC PANEL
CO2: 29 mEq/L (ref 19–32)
Calcium: 9.5 mg/dL (ref 8.4–10.5)
Sodium: 144 mEq/L (ref 135–145)

## 2012-11-10 LAB — POCT URINALYSIS DIPSTICK
Ketones, UA: NEGATIVE
Protein, UA: NEGATIVE
Spec Grav, UA: 1.025
Urobilinogen, UA: 0.2
pH, UA: 6.5

## 2012-11-24 ENCOUNTER — Encounter: Payer: Self-pay | Admitting: Family

## 2012-11-24 ENCOUNTER — Ambulatory Visit (INDEPENDENT_AMBULATORY_CARE_PROVIDER_SITE_OTHER): Payer: BC Managed Care – PPO | Admitting: Family

## 2012-11-24 VITALS — BP 142/94 | HR 71 | Ht 65.5 in | Wt 179.0 lb

## 2012-11-24 DIAGNOSIS — E7800 Pure hypercholesterolemia, unspecified: Secondary | ICD-10-CM

## 2012-11-24 DIAGNOSIS — R7989 Other specified abnormal findings of blood chemistry: Secondary | ICD-10-CM

## 2012-11-24 DIAGNOSIS — Z0000 Encounter for general adult medical examination without abnormal findings: Secondary | ICD-10-CM

## 2012-11-24 MED ORDER — ATORVASTATIN CALCIUM 10 MG PO TABS: 10.0000 mg | ORAL_TABLET | Freq: Every day | ORAL | Status: DC

## 2012-11-24 NOTE — Patient Instructions (Addendum)

## 2012-11-24 NOTE — Progress Notes (Signed)
Subjective:    Patient ID: Brandy Proctor, female    DOB: November 11, 1960, 52 y.o.   MRN: [id number]  HPI  52 year old, nonsmoker, is in for a routine physical examination for this healthy  Female. Reviewed all health maintenance protocols including mammography colonoscopy bone density and reviewed appropriate screening labs. Her immunization history was reviewed as well as her current medications and allergies refills of her chronic medications were given and the plan for yearly health maintenance was discussed all orders and referrals were made as appropriate.   Review of Systems  Constitutional: Negative.   HENT: Negative.   Eyes: Negative.   Respiratory: Negative.   Cardiovascular: Negative.   Gastrointestinal: Negative.   Endocrine: Negative.   Genitourinary: Negative.   Musculoskeletal: Negative.   Skin: Negative.   Allergic/Immunologic: Negative.   Neurological: Negative.   Hematological: Negative.   Psychiatric/Behavioral: Negative.    Past Medical History  Diagnosis Date  . Hyperlipidemia     History   Social History  . Marital Status: Married    Spouse Name: N/A    Number of Children: N/A  . Years of Education: N/A   Occupational History  . Not on file.   Social History Main Topics  . Smoking status: Never Smoker   . Smokeless tobacco: Not on file  . Alcohol Use:   . Drug Use:   . Sexual Activity:    Other Topics Concern  . Not on file   Social History Narrative  . No narrative on file    Past Surgical History  Procedure Laterality Date  . Cesarean section      x3  . Tumor removal  1990    around facial nerve    Family History  Problem Relation Age of Onset  . Hyperlipidemia Mother   . Heart disease Father     CABG  . Hyperlipidemia Father   . Heart disease Brother 25    CAD    Allergies  Allergen Reactions  . Penicillins     REACTION: rash    Current Outpatient Prescriptions on File Prior to Visit  Medication Sig Dispense Refill  .  aspirin 81 MG tablet Take 81 mg by mouth daily.        . Calcium Carbonate-Vit D-Min (CALTRATE 600+D PLUS) 600-400 MG-UNIT per tablet Take 1 tablet by mouth daily.        . fish oil-omega-3 fatty acids 1000 MG capsule Take 2 g by mouth daily.        . folic acid (FOLVITE) 1 MG tablet Take 1 tablet by mouth daily.      Marland Kitchen levothyroxine (SYNTHROID, LEVOTHROID) 25 MCG tablet Take 1 tablet by mouth daily.      . Multiple Vitamin (MULTIVITAMIN) tablet Take 1 tablet by mouth daily.         No current facility-administered medications on file prior to visit.    BP 142/94  Pulse 71  Ht 5' 5.5" (1.664 m)  Wt 179 lb (81.194 kg)  BMI 29.32 kg/m2chart    Objective:   Physical Exam  Constitutional: She is oriented to person, place, and time. She appears well-developed and well-nourished.  HENT:  Head: Normocephalic and atraumatic.  Right Ear: External ear normal.  Left Ear: External ear normal.  Nose: Nose normal.  Mouth/Throat: Oropharynx is clear and moist.  Eyes: Conjunctivae and EOM are normal. Pupils are equal, round, and reactive to light.  Neck: Normal range of motion. Neck supple. No thyromegaly present.  Cardiovascular: Normal rate, regular rhythm and normal heart sounds.   Pulmonary/Chest: Effort normal and breath sounds normal.  Abdominal: Soft. Bowel sounds are normal. She exhibits no distension. There is no tenderness. There is no rebound.  Musculoskeletal: Normal range of motion. She exhibits no edema and no tenderness.  Neurological: She is alert and oriented to person, place, and time. She displays normal reflexes. No cranial nerve deficit. Coordination normal.  Skin: Skin is warm and dry.  Psychiatric: She has a normal mood and affect.          Assessment & Plan:  Assessment:  1. CPX 2. Hypercholesterolemia  3. Elevated LFT  Plan: LFTs are elevated likely as a result of statin but stable. Dr. Cato Mulligan has recommened she return for a recheck in 6 months. Exercise daily.  Self breast exams monthly. Call the office with any questions or concerns.

## 2012-11-25 ENCOUNTER — Ambulatory Visit
Admission: RE | Admit: 2012-11-25 | Discharge: 2012-11-25 | Disposition: A | Discharge status: Home / Self Care | Payer: BC Managed Care – PPO | Source: Ambulatory Visit

## 2012-11-25 DIAGNOSIS — Z1231 Encounter for screening mammogram for malignant neoplasm of breast: Secondary | ICD-10-CM

## 2012-12-15 ENCOUNTER — Other Ambulatory Visit: Payer: Self-pay | Admitting: Endocrinology

## 2012-12-15 DIAGNOSIS — E049 Nontoxic goiter, unspecified: Secondary | ICD-10-CM

## 2012-12-26 ENCOUNTER — Encounter: Payer: Self-pay | Admitting: Internal Medicine

## 2012-12-26 ENCOUNTER — Ambulatory Visit (INDEPENDENT_AMBULATORY_CARE_PROVIDER_SITE_OTHER): Payer: BC Managed Care – PPO | Admitting: Internal Medicine

## 2012-12-26 VITALS — BP 136/86 | HR 73 | Temp 98.3°F | Wt 182.0 lb

## 2012-12-26 DIAGNOSIS — H1033 Unspecified acute conjunctivitis, bilateral: Secondary | ICD-10-CM

## 2012-12-26 DIAGNOSIS — H1030 Unspecified acute conjunctivitis, unspecified eye: Secondary | ICD-10-CM

## 2012-12-26 MED ORDER — POLYMYXIN B-TRIMETHOPRIM 10000-0.1 UNIT/ML-% OP SOLN: 1.0000 [drp] | OPHTHALMIC | Status: DC

## 2012-12-26 NOTE — Patient Instructions (Signed)

## 2012-12-26 NOTE — Progress Notes (Signed)
Chief Complaint  Patient presents with  . Conjunctivitis    Started yesterday with a little irritation.  Woke this morning with matted eyes that are red and swollen.    HPI: Patient comes in today for SDA for  new problem evaluation. PCP NA Awoke this a.m. with red eyes sclerae shot mostly white and thick discharge. Like pinkeye when kids were younger. No specific exposures. No uri sx   Dry feeling yesterday  Then awake with eyes closed shut whit.e has occasional allergies no flare TRx left over rx.  Drops.  NO contacts.  ROS: See pertinent positives and negatives per HPI. No fever photophobia adenopathy  Past Medical History  Diagnosis Date  . Hyperlipidemia     Family History  Problem Relation Age of Onset  . Hyperlipidemia Mother   . Heart disease Father     CABG  . Hyperlipidemia Father   . Heart disease Brother 38    CAD    History   Social History  . Marital Status: Married    Spouse Name: N/A    Number of Children: N/A  . Years of Education: N/A   Social History Main Topics  . Smoking status: Never Smoker   . Smokeless tobacco: None  . Alcohol Use:   . Drug Use:   . Sexual Activity:    Other Topics Concern  . None   Social History Narrative  . None    Outpatient Encounter Prescriptions as of 12/26/2012  Medication Sig Dispense Refill  . aspirin 81 MG tablet Take 81 mg by mouth daily.        Marland Kitchen atorvastatin (LIPITOR) 10 MG tablet Take 1 tablet (10 mg total) by mouth daily.  90 tablet  3  . Calcium Carbonate-Vit D-Min (CALTRATE 600+D PLUS) 600-400 MG-UNIT per tablet Take 1 tablet by mouth daily.        . fish oil-omega-3 fatty acids 1000 MG capsule Take 2 g by mouth daily.        . folic acid (FOLVITE) 1 MG tablet Take 1 tablet by mouth daily.      Marland Kitchen levothyroxine (SYNTHROID, LEVOTHROID) 25 MCG tablet Take 1 tablet by mouth daily.      . Multiple Vitamin (MULTIVITAMIN) tablet Take 1 tablet by mouth daily.        Marland Kitchen trimethoprim-polymyxin b (POLYTRIM)  ophthalmic solution Place 1 drop into both eyes every 4 (four) hours.  10 mL  0   No facility-administered encounter medications on file as of 12/26/2012.    EXAM:  BP 136/86  Pulse 73  Temp(Src) 98.3 F (36.8 C) (Oral)  Wt 182 lb (82.555 kg)  BMI 29.82 kg/m2  SpO2 98%  Body mass index is 29.82 kg/(m^2).  GENERAL: vitals reviewed and listed above, alert, oriented, appears well hydrated and in no acute distress with obvious red and weepy eyes. HEENT: atraumatic, conjunctiva  Red 2+ no ciliary flush and no photophobia no obvious abnormalities on inspection of external nose and ears OP : no lesion edema or exudate TMs are clear. NECK: no obvious masses on inspection palpation no adenopathy Skin no acute unusual rashes MS: moves all extremities without noticeable focal  abnormality  PSYCH: pleasant and cooperative, no obvious depression or anxiety  ASSESSMENT AND PLAN:  Discussed the following assessment and plan:  Acute conjunctivitis of both eyes - Viral versus bacterial no complicating factors Warm compresses eyedrops expectant management. -Patient advised to return or notify health care team  if symptoms worsen or persist  or new concerns arise.  Patient Instructions    Conjunctivitis Conjunctivitis is commonly called "pink eye." Conjunctivitis can be caused by bacterial or viral infection, allergies, or injuries. There is usually redness of the lining of the eye, itching, discomfort, and sometimes discharge. There may be deposits of matter along the eyelids. A viral infection usually causes a watery discharge, while a bacterial infection causes a yellowish, thick discharge. Pink eye is very contagious and spreads by direct contact. You may be given antibiotic eyedrops as part of your treatment. Before using your eye medicine, remove all drainage from the eye by washing gently with warm water and cotton balls. Continue to use the medication until you have awakened 2 mornings in a  row without discharge from the eye. Do not rub your eye. This increases the irritation and helps spread infection. Use separate towels from other household members. Wash your hands with soap and water before and after touching your eyes. Use cold compresses to reduce pain and sunglasses to relieve irritation from light. Do not wear contact lenses or wear eye makeup until the infection is gone. SEEK MEDICAL CARE IF:   Your symptoms are not better after 3 days of treatment.  You have increased pain or trouble seeing.  The outer eyelids become very red or swollen. Document Released: 04/12/2004 Document Revised: 05/28/2011 Document Reviewed: 03/05/2005 [facility] Patient Information 2014 [place], [place].      Neta Mends. Charleen Madera M.D.

## 2013-01-22 ENCOUNTER — Other Ambulatory Visit: Payer: Self-pay

## 2013-01-30 ENCOUNTER — Other Ambulatory Visit: Payer: Self-pay | Admitting: Internal Medicine

## 2013-02-09 ENCOUNTER — Encounter: Payer: BC Managed Care – PPO | Admitting: Internal Medicine

## 2013-02-11 ENCOUNTER — Encounter: Payer: Self-pay | Admitting: Family Medicine

## 2013-02-11 ENCOUNTER — Ambulatory Visit (INDEPENDENT_AMBULATORY_CARE_PROVIDER_SITE_OTHER): Payer: BC Managed Care – PPO | Admitting: Family Medicine

## 2013-02-11 VITALS — BP 140/86 | HR 81 | Temp 98.6°F | Wt 186.0 lb

## 2013-02-11 DIAGNOSIS — H109 Unspecified conjunctivitis: Secondary | ICD-10-CM

## 2013-02-11 MED ORDER — TOBRAMYCIN-DEXAMETHASONE 0.3-0.1 % OP SUSP: 1.0000 [drp] | OPHTHALMIC | Status: DC

## 2013-02-11 NOTE — Progress Notes (Signed)
Pre visit review using our clinic review tool, if applicable. No additional management support is needed unless otherwise documented below in the visit note. 

## 2013-02-11 NOTE — Progress Notes (Signed)
  Subjective:    Patient ID: Brandy Proctor, female    DOB: 1961-01-15, 52 y.o.   MRN: [id number]  HPI Here for continued burning and tearing of both eyes. This started about 6 weeks ago. She does have other allergy sx as well including runny nose and sneezing. Using cortisoporin drops with little benefit. She does not wear contacts.   Review of Systems  Constitutional: Negative.   HENT: Positive for congestion, postnasal drip, rhinorrhea and sneezing. Negative for sore throat.   Eyes: Positive for discharge, redness and itching. Negative for photophobia and visual disturbance.  Respiratory: Negative.        Objective:   Physical Exam  Constitutional: She appears well-developed and well-nourished.  HENT:  Right Ear: External ear normal.  Left Ear: External ear normal.  Nose: Nose normal.  Mouth/Throat: Oropharynx is clear and moist.  Eyes:  Both conjunctivae are pink, no DC seen   Lymphadenopathy:    She has no cervical adenopathy.          Assessment & Plan:  This may be allergic in nature. Switch to Tobradex drops prn. Add a Claritin daily.

## 2013-05-21 ENCOUNTER — Other Ambulatory Visit (INDEPENDENT_AMBULATORY_CARE_PROVIDER_SITE_OTHER): Payer: BC Managed Care – PPO

## 2013-05-21 DIAGNOSIS — T50995A Adverse effect of other drugs, medicaments and biological substances, initial encounter: Secondary | ICD-10-CM

## 2013-05-21 LAB — HEPATIC FUNCTION PANEL
ALT: 24 U/L (ref 0–35)
AST: 29 U/L (ref 0–37)
Albumin: 4.1 g/dL (ref 3.5–5.2)
Alkaline Phosphatase: 61 U/L (ref 39–117)
BILIRUBIN TOTAL: 0.6 mg/dL (ref 0.3–1.2)
Bilirubin, Direct: 0.1 mg/dL (ref 0.0–0.3)
TOTAL PROTEIN: 6.8 g/dL (ref 6.0–8.3)

## 2013-08-26 ENCOUNTER — Encounter: Payer: Self-pay | Admitting: Internal Medicine

## 2013-08-26 ENCOUNTER — Ambulatory Visit (INDEPENDENT_AMBULATORY_CARE_PROVIDER_SITE_OTHER): Payer: Managed Care, Other (non HMO) | Admitting: Internal Medicine

## 2013-08-26 VITALS — BP 122/78 | HR 87 | Temp 98.6°F | Ht 65.5 in | Wt 185.0 lb

## 2013-08-26 DIAGNOSIS — S30860A Insect bite (nonvenomous) of lower back and pelvis, initial encounter: Secondary | ICD-10-CM

## 2013-08-26 DIAGNOSIS — W57XXXA Bitten or stung by nonvenomous insect and other nonvenomous arthropods, initial encounter: Principal | ICD-10-CM

## 2013-08-26 MED ORDER — DOXYCYCLINE HYCLATE 100 MG PO CAPS: 100.0000 mg | ORAL_CAPSULE | Freq: Two times a day (BID) | ORAL | Status: DC

## 2013-08-26 NOTE — Progress Notes (Signed)
Pre visit review using our clinic review tool, if applicable. No additional management support is needed unless otherwise documented below in the visit note. 

## 2013-08-26 NOTE — Patient Instructions (Signed)
This looks like a local reaction to tick bite and not lyme disease  Or infection. However if you get a fever 101 or flulike illnes or spreading  Or other unusual rash  Contact medical team, asap.  Doxycycline would treat some tick born infections if needed  To have on hand  .   Tick Bite Information Ticks are insects that attach themselves to the skin and draw blood for food. There are various types of ticks. Common types include wood ticks and deer ticks. Most ticks live in shrubs and grassy areas. Ticks can climb onto your body when you make contact with leaves or grass where the tick is waiting. The most common places on the body for ticks to attach themselves are the scalp, neck, armpits, waist, and groin. Most tick bites are harmless, but sometimes ticks carry germs that cause diseases. These germs can be spread to a person during the tick's feeding process. The chance of a disease spreading through a tick bite depends on:   The type of tick.  Time of year.   How long the tick is attached.   Geographic location.  HOW CAN YOU PREVENT TICK BITES? Take these steps to help prevent tick bites when you are outdoors:  Wear protective clothing. Long sleeves and long pants are best.   Wear white clothes so you can see ticks more easily.  Tuck your pant legs into your socks.   If walking on a trail, stay in the middle of the trail to avoid brushing against bushes.  Avoid walking through areas with long grass.  Put insect repellent on all exposed skin and along boot tops, pant legs, and sleeve cuffs.   Check clothing, hair, and skin repeatedly and before going inside.   Brush off any ticks that are not attached.  Take a shower or bath as soon as possible after being outdoors.  WHAT IS THE PROPER WAY TO REMOVE A TICK? Ticks should be removed as soon as possible to help prevent diseases caused by tick bites. 1. If latex gloves are available, put them on before trying to remove  a tick.  2. Using fine-point tweezers, grasp the tick as close to the skin as possible. You may also use curved forceps or a tick removal tool. Grasp the tick as close to its head as possible. Avoid grasping the tick on its body. 3. Pull gently with steady upward pressure until the tick lets go. Do not twist the tick or jerk it suddenly. This may break off the tick's head or mouth parts. 4. Do not squeeze or crush the tick's body. This could force disease-carrying fluids from the tick into your body.  5. After the tick is removed, wash the bite area and your hands with soap and water or other disinfectant such as alcohol. 6. Apply a small amount of antiseptic cream or ointment to the bite site.  7. Wash and disinfect any instruments that were used.  Do not try to remove a tick by applying a hot match, petroleum jelly, or fingernail polish to the tick. These methods do not work and may increase the chances of disease being spread from the tick bite.  WHEN SHOULD YOU SEEK MEDICAL CARE? Contact your health care provider if you are unable to remove a tick from your skin or if a part of the tick breaks off and is stuck in the skin.  After a tick bite, you need to be aware of signs and symptoms that  could be related to diseases spread by ticks. Contact your health care provider if you develop any of the following in the days or weeks after the tick bite:  Unexplained fever.  Rash. A circular rash that appears days or weeks after the tick bite may indicate the possibility of Lyme disease. The rash may resemble a target with a bull's-eye and may occur at a different part of your body than the tick bite.  Redness and swelling in the area of the tick bite.   Tender, swollen lymph glands.   Diarrhea.   Weight loss.   Cough.   Fatigue.   Muscle, joint, or bone pain.   Abdominal pain.   Headache.   Lethargy or a change in your level of consciousness.  Difficulty walking or moving  your legs.   Numbness in the legs.   Paralysis.  Shortness of breath.   Confusion.   Repeated vomiting.  Document Released: 03/02/2000 Document Revised: 12/24/2012 Document Reviewed: 08/13/2012 [facility] Patient Information 2014 [place], [place].

## 2013-08-26 NOTE — Progress Notes (Signed)
Chief Complaint  Patient presents with  . Insect Bite    tick bite left arm    HPI: Patient comes in today for SDA for  new problem evaluation. PCP NA about 6 days ago had a tick removed from her left back she obtained a tick and was near her house in [place]. No major illness with this may be some allergy sinus symptoms. Noticed some redness around the area without significant pain or itching. Is going out of town would like it looked at in case intervention appropriate. She was uncertain whether she got the whole tick out. ROS: See pertinent positives and negatives per HPI. No fever chills  Past Medical History  Diagnosis Date  . Hyperlipidemia     Family History  Problem Relation Age of Onset  . Hyperlipidemia Mother   . Heart disease Father     CABG  . Hyperlipidemia Father   . Heart disease Brother 57    CAD    History   Social History  . Marital Status: Married    Spouse Name: N/A    Number of Children: N/A  . Years of Education: N/A   Social History Main Topics  . Smoking status: Never Smoker   . Smokeless tobacco: Never Used  . Alcohol Use: Yes     Comment: occ  . Drug Use: No  . Sexual Activity: None   Other Topics Concern  . None   Social History Narrative  . None    Outpatient Encounter Prescriptions as of 08/26/2013  Medication Sig  . aspirin 81 MG tablet Take 81 mg by mouth daily.    Marland Kitchen atorvastatin (LIPITOR) 10 MG tablet TAKE 1 TABLET EVERY DAY  . Calcium Carbonate-Vit D-Min (CALTRATE 600+D PLUS) 600-400 MG-UNIT per tablet Take 1 tablet by mouth daily.    . fish oil-omega-3 fatty acids 1000 MG capsule Take 2 g by mouth daily.    . folic acid (FOLVITE) 1 MG tablet Take 1 tablet by mouth daily.  Marland Kitchen levothyroxine (SYNTHROID, LEVOTHROID) 25 MCG tablet Take 1 tablet by mouth daily.  . Multiple Vitamin (MULTIVITAMIN) tablet Take 1 tablet by mouth daily.    Marland Kitchen doxycycline (VIBRAMYCIN) 100 MG capsule Take 1 capsule (100 mg total) by mouth 2  (two) times daily.  . [DISCONTINUED] tobramycin-dexamethasone (TOBRADEX) ophthalmic solution Place 1 drop into both eyes every 4 (four) hours while awake.    EXAM:  BP 122/78  Pulse 87  Temp(Src) 98.6 F (37 C) (Oral)  Ht 5' 5.5" (1.664 m)  Wt 185 lb (83.915 kg)  BMI 30.31 kg/m2  Body mass index is 30.31 kg/(m^2).  GENERAL: vitals reviewed and listed above, alert, oriented, appears well hydrated and in no acute distress HEENT: atraumatic, conjunctiva  clear, no obvious abnormalities on inspection of external nose and ears NECK: no obvious masses on inspection palpation  Skin left upper hback with small 1 c or less redness and center without FB or abscess or pustule noted . No ob other rash ( not undressed)  MS: moves all extremities without noticeable focal  abnormality PSYCH: pleasant and cooperative, no obvious depression or anxiety  ASSESSMENT AND PLAN:  Discussed the following assessment and plan:  Tick bite of back - small local reaction observe and reviewed alarm sx  rx gievn as going out of town for 2 weeks and call for advice and care eval.  antibiotic rx printed to use only after healthcare advice  -Patient advised to return or notify health  care team  if symptoms worsen ,persist or new concerns arise.  Patient Instructions  This looks like a local reaction to tick bite and not lyme disease  Or infection. However if you get a fever 101 or flulike illnes or spreading  Or other unusual rash  Contact medical team, asap.  Doxycycline would treat some tick born infections if needed  To have on hand  .   Tick Bite Information Ticks are insects that attach themselves to the skin and draw blood for food. There are various types of ticks. Common types include wood ticks and deer ticks. Most ticks live in shrubs and grassy areas. Ticks can climb onto your body when you make contact with leaves or grass where the tick is waiting. The most common places on the body for ticks to  attach themselves are the scalp, neck, armpits, waist, and groin. Most tick bites are harmless, but sometimes ticks carry germs that cause diseases. These germs can be spread to a person during the tick's feeding process. The chance of a disease spreading through a tick bite depends on:   The type of tick.  Time of year.   How long the tick is attached.   Geographic location.  HOW CAN YOU PREVENT TICK BITES? Take these steps to help prevent tick bites when you are outdoors:  Wear protective clothing. Long sleeves and long pants are best.   Wear white clothes so you can see ticks more easily.  Tuck your pant legs into your socks.   If walking on a trail, stay in the middle of the trail to avoid brushing against bushes.  Avoid walking through areas with long grass.  Put insect repellent on all exposed skin and along boot tops, pant legs, and sleeve cuffs.   Check clothing, hair, and skin repeatedly and before going inside.   Brush off any ticks that are not attached.  Take a shower or bath as soon as possible after being outdoors.  WHAT IS THE PROPER WAY TO REMOVE A TICK? Ticks should be removed as soon as possible to help prevent diseases caused by tick bites. 1. If latex gloves are available, put them on before trying to remove a tick.  2. Using fine-point tweezers, grasp the tick as close to the skin as possible. You may also use curved forceps or a tick removal tool. Grasp the tick as close to its head as possible. Avoid grasping the tick on its body. 3. Pull gently with steady upward pressure until the tick lets go. Do not twist the tick or jerk it suddenly. This may break off the tick's head or mouth parts. 4. Do not squeeze or crush the tick's body. This could force disease-carrying fluids from the tick into your body.  5. After the tick is removed, wash the bite area and your hands with soap and water or other disinfectant such as alcohol. 6. Apply a small  amount of antiseptic cream or ointment to the bite site.  7. Wash and disinfect any instruments that were used.  Do not try to remove a tick by applying a hot match, petroleum jelly, or fingernail polish to the tick. These methods do not work and may increase the chances of disease being spread from the tick bite.  WHEN SHOULD YOU SEEK MEDICAL CARE? Contact your health care provider if you are unable to remove a tick from your skin or if a part of the tick breaks off and is stuck in the  skin.  After a tick bite, you need to be aware of signs and symptoms that could be related to diseases spread by ticks. Contact your health care provider if you develop any of the following in the days or weeks after the tick bite:  Unexplained fever.  Rash. A circular rash that appears days or weeks after the tick bite may indicate the possibility of Lyme disease. The rash may resemble a target with a bull's-eye and may occur at a different part of your body than the tick bite.  Redness and swelling in the area of the tick bite.   Tender, swollen lymph glands.   Diarrhea.   Weight loss.   Cough.   Fatigue.   Muscle, joint, or bone pain.   Abdominal pain.   Headache.   Lethargy or a change in your level of consciousness.  Difficulty walking or moving your legs.   Numbness in the legs.   Paralysis.  Shortness of breath.   Confusion.   Repeated vomiting.  Document Released: 03/02/2000 Document Revised: 12/24/2012 Document Reviewed: 08/13/2012 [facility] Patient Information 2014 [place], [place].      Neta MendsWanda K. Brittanni Cariker M.D.

## 2013-09-15 ENCOUNTER — Ambulatory Visit
Admission: RE | Admit: 2013-09-15 | Discharge: 2013-09-15 | Disposition: A | Discharge status: Home / Self Care | Payer: Managed Care, Other (non HMO) | Source: Ambulatory Visit | Attending: Endocrinology | Admitting: Endocrinology

## 2013-09-15 DIAGNOSIS — E049 Nontoxic goiter, unspecified: Secondary | ICD-10-CM

## 2013-09-21 ENCOUNTER — Other Ambulatory Visit: Payer: BC Managed Care – PPO

## 2013-10-19 ENCOUNTER — Other Ambulatory Visit: Payer: Self-pay

## 2013-10-19 DIAGNOSIS — Z1231 Encounter for screening mammogram for malignant neoplasm of breast: Secondary | ICD-10-CM

## 2013-11-30 ENCOUNTER — Ambulatory Visit: Payer: Managed Care, Other (non HMO)

## 2013-12-09 ENCOUNTER — Ambulatory Visit
Admission: RE | Admit: 2013-12-09 | Discharge: 2013-12-09 | Disposition: A | Discharge status: Home / Self Care | Payer: Managed Care, Other (non HMO) | Source: Ambulatory Visit

## 2013-12-09 DIAGNOSIS — Z1231 Encounter for screening mammogram for malignant neoplasm of breast: Secondary | ICD-10-CM

## 2013-12-11 ENCOUNTER — Other Ambulatory Visit: Payer: Self-pay | Admitting: Obstetrics

## 2013-12-11 DIAGNOSIS — R928 Other abnormal and inconclusive findings on diagnostic imaging of breast: Secondary | ICD-10-CM

## 2013-12-15 ENCOUNTER — Other Ambulatory Visit: Payer: Self-pay | Admitting: Obstetrics

## 2013-12-15 ENCOUNTER — Ambulatory Visit
Admission: RE | Admit: 2013-12-15 | Discharge: 2013-12-15 | Disposition: A | Discharge status: Home / Self Care | Payer: Managed Care, Other (non HMO) | Source: Ambulatory Visit | Attending: Obstetrics | Admitting: Obstetrics

## 2013-12-15 DIAGNOSIS — N6001 Solitary cyst of right breast: Secondary | ICD-10-CM

## 2013-12-15 DIAGNOSIS — R928 Other abnormal and inconclusive findings on diagnostic imaging of breast: Secondary | ICD-10-CM

## 2013-12-18 ENCOUNTER — Ambulatory Visit
Admission: RE | Admit: 2013-12-18 | Discharge: 2013-12-18 | Disposition: A | Discharge status: Home / Self Care | Payer: Managed Care, Other (non HMO) | Source: Ambulatory Visit | Attending: Obstetrics | Admitting: Obstetrics

## 2013-12-18 DIAGNOSIS — N6001 Solitary cyst of right breast: Secondary | ICD-10-CM

## 2014-02-05 ENCOUNTER — Other Ambulatory Visit: Payer: Self-pay | Admitting: Internal Medicine

## 2014-02-10 ENCOUNTER — Other Ambulatory Visit: Payer: Self-pay | Admitting: Internal Medicine

## 2014-03-26 ENCOUNTER — Other Ambulatory Visit: Payer: Self-pay | Admitting: *Deleted

## 2014-03-26 MED ORDER — ATORVASTATIN CALCIUM 10 MG PO TABS: 10.0000 mg | ORAL_TABLET | Freq: Every day | ORAL | Status: DC

## 2014-03-27 ENCOUNTER — Other Ambulatory Visit: Payer: Self-pay | Admitting: Family Medicine

## 2014-03-29 MED ORDER — ATORVASTATIN CALCIUM 10 MG PO TABS: 10.0000 mg | ORAL_TABLET | Freq: Every day | ORAL | Status: DC

## 2014-04-05 ENCOUNTER — Encounter: Payer: Self-pay | Admitting: Family Medicine

## 2014-04-05 ENCOUNTER — Other Ambulatory Visit: Payer: Self-pay | Admitting: Endocrinology

## 2014-04-05 ENCOUNTER — Ambulatory Visit (INDEPENDENT_AMBULATORY_CARE_PROVIDER_SITE_OTHER): Payer: Managed Care, Other (non HMO) | Admitting: Family Medicine

## 2014-04-05 VITALS — BP 140/89 | HR 79 | Temp 98.4°F | Ht 65.5 in | Wt 177.0 lb

## 2014-04-05 DIAGNOSIS — E785 Hyperlipidemia, unspecified: Secondary | ICD-10-CM

## 2014-04-05 DIAGNOSIS — E049 Nontoxic goiter, unspecified: Secondary | ICD-10-CM

## 2014-04-05 LAB — LIPID PANEL
Cholesterol: 190 mg/dL (ref 0–200)
HDL: 57.4 mg/dL (ref >39.00)
LDL Cholesterol: 105 mg/dL — ABNORMAL HIGH (ref 0–99)
NONHDL: 132.6
Total CHOL/HDL Ratio: 3
Triglycerides: 138 mg/dL (ref 0.0–149.0)
VLDL: 27.6 mg/dL (ref 0.0–40.0)

## 2014-04-05 MED ORDER — ATORVASTATIN CALCIUM 10 MG PO TABS: 10.0000 mg | ORAL_TABLET | Freq: Every day | ORAL | Status: DC

## 2014-04-05 NOTE — Progress Notes (Signed)
   Subjective:    Patient ID: Brandy Proctor, female    DOB: 12/29/1960, 54 y.o.   MRN: [id number]  HPI Here to follow up on lipids. She is watching her diet. She had some labs at her GYN office in November but this only checked a total cholesterol and an HDL. Dr. Talmage NapBalan is treating her thyroid condition. She feels well.    Review of Systems  Constitutional: Negative.   Respiratory: Negative.   Cardiovascular: Negative.        Objective:   Physical Exam  Constitutional: She appears well-developed and well-nourished.  Cardiovascular: Normal rate, regular rhythm, normal heart sounds and intact distal pulses.   Pulmonary/Chest: Effort normal and breath sounds normal.          Assessment & Plan:  We will check a fasting lipid level today.

## 2014-04-05 NOTE — Progress Notes (Signed)
Pre visit review using our clinic review tool, if applicable. No additional management support is needed unless otherwise documented below in the visit note. 

## 2014-04-08 ENCOUNTER — Encounter: Payer: Self-pay | Admitting: Internal Medicine

## 2014-05-11 IMAGING — US US SOFT TISSUE HEAD/NECK
1 series · 14 of 25 positions shown · non-contrast
Comparison: 12/12/2010.

CLINICAL DATA: Thyroid goiter.

THYROID ULTRASOUND
TECHNIQUE: Ultrasound examination of the thyroid gland and adjacent
soft tissues was performed.

[Series 1: us soft tissue head/neck · 0.08mm/px · 14 of 34 slices shown]
[im 1/34]
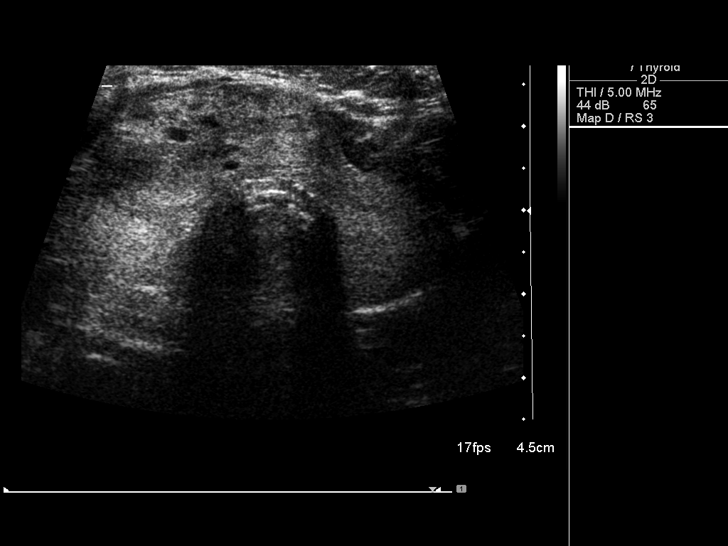
[im 3/34]
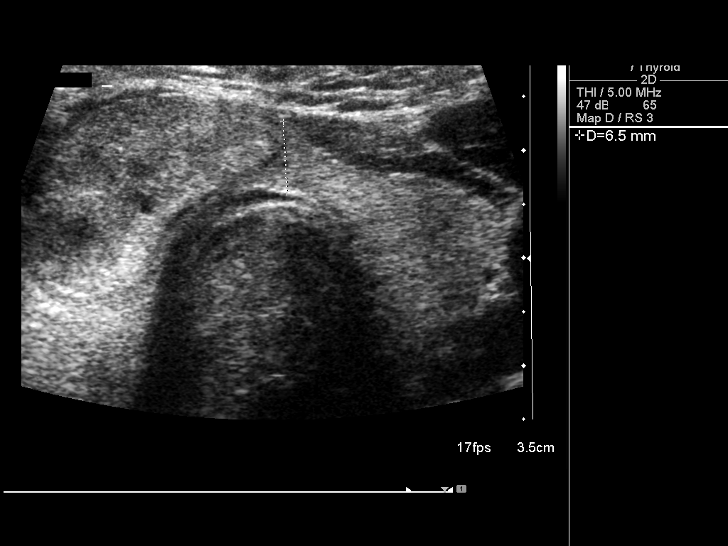
[im 6/34]
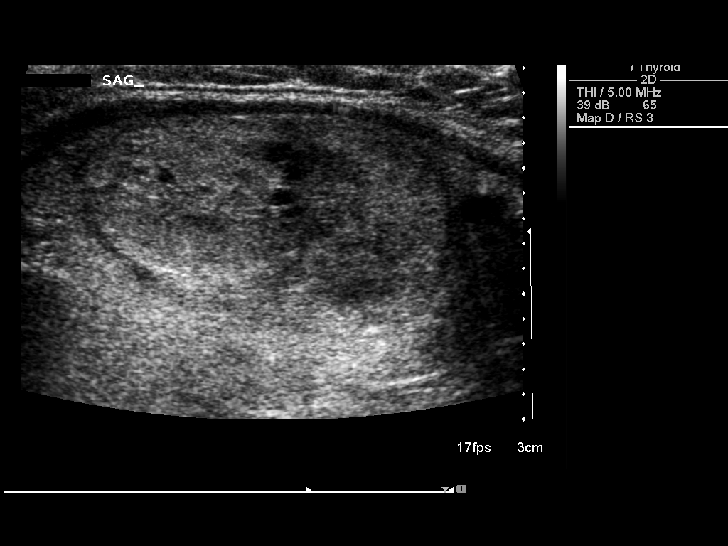
[im 9/34]
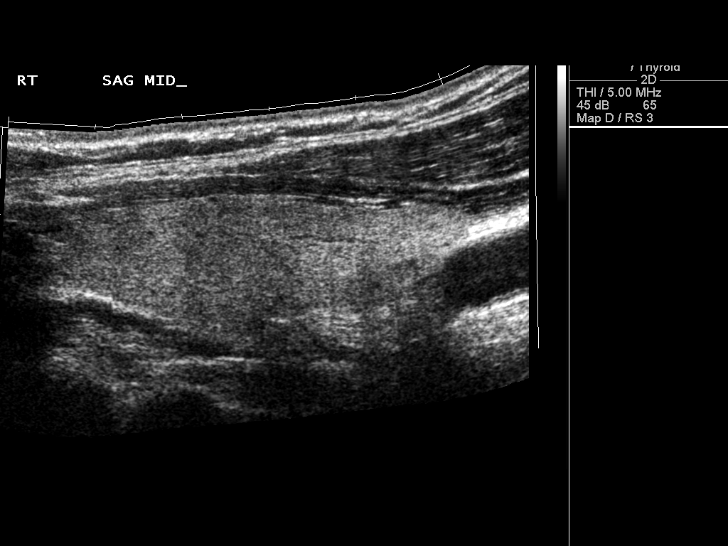
[im 12/34]
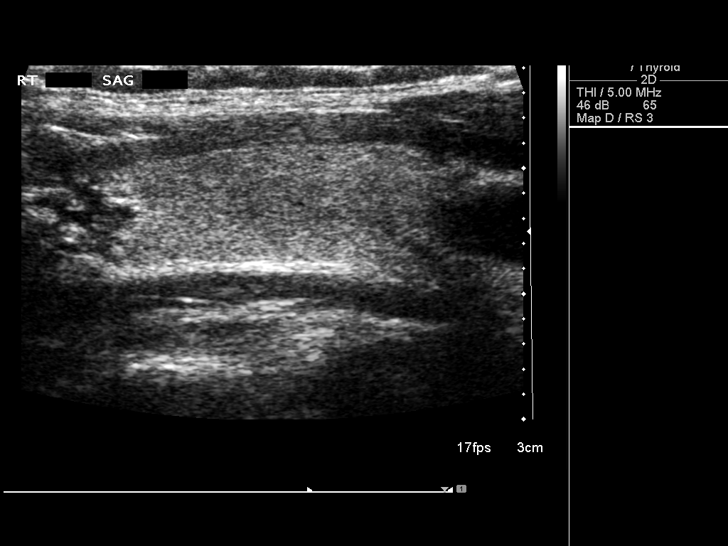
[im 13/34]
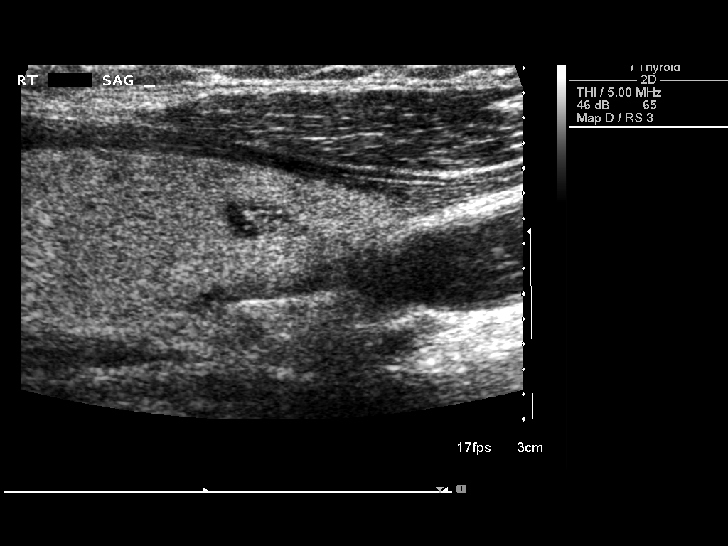
[im 16/34]
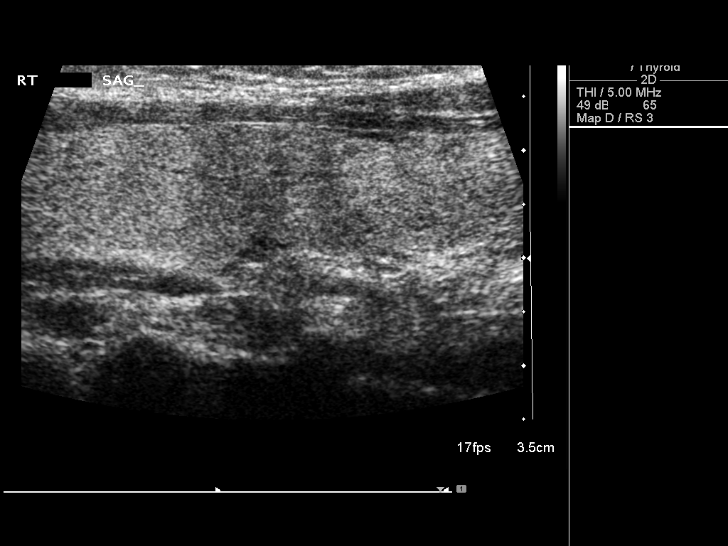
[im 18/34]
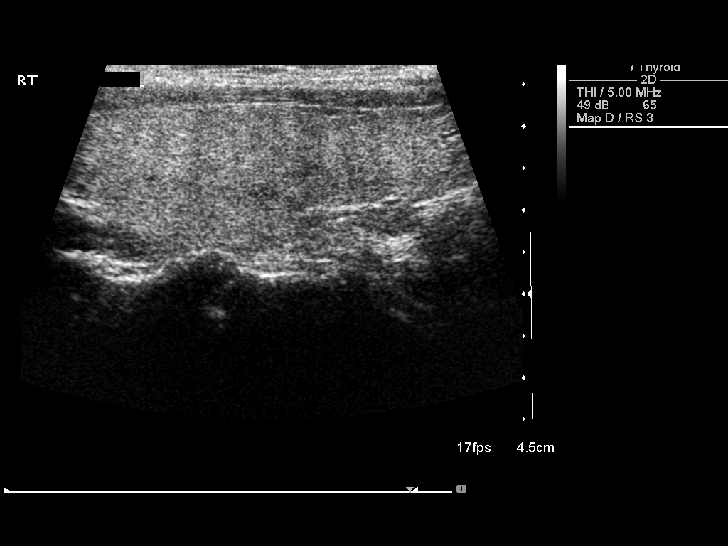
[im 21/34]
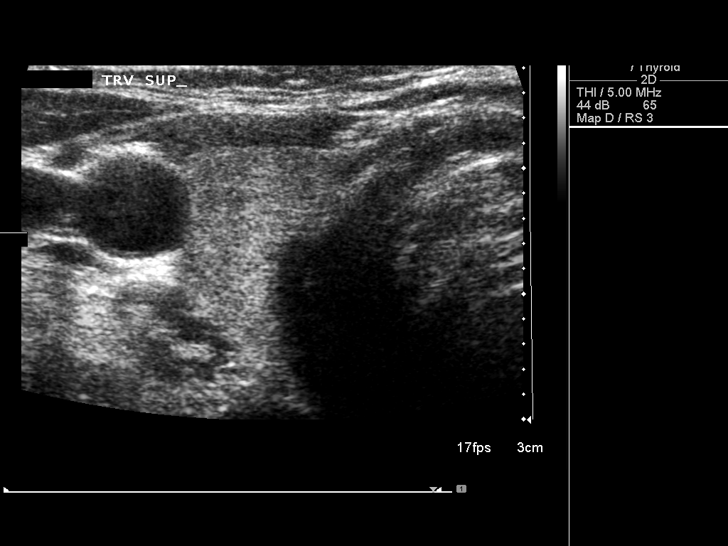
[im 23/34]
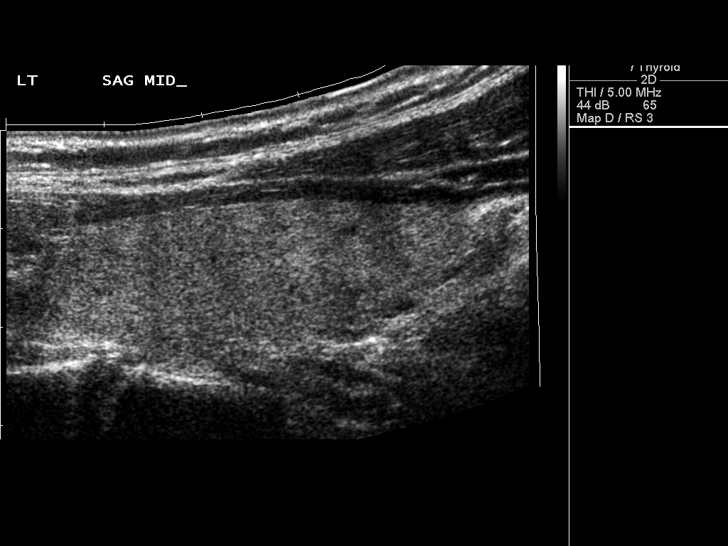
[im 25/34]
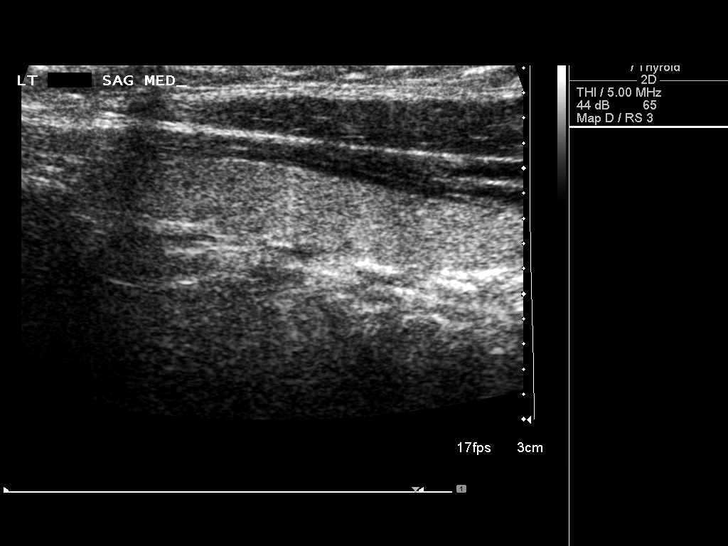
[im 28/34]
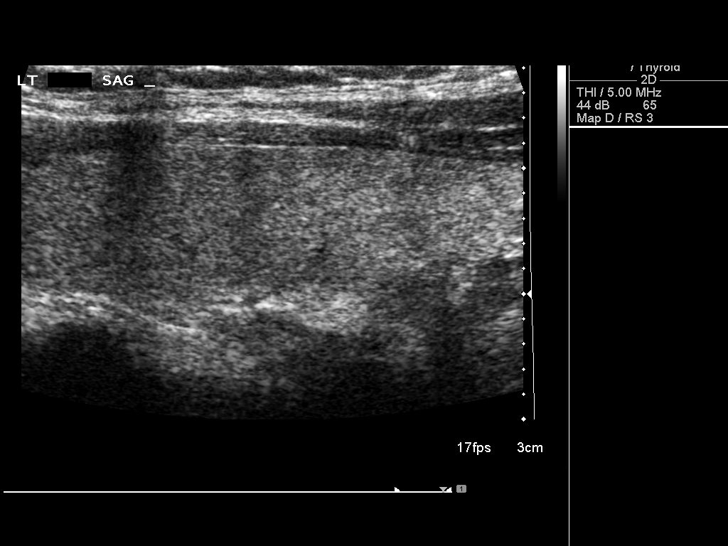
[im 31/34]
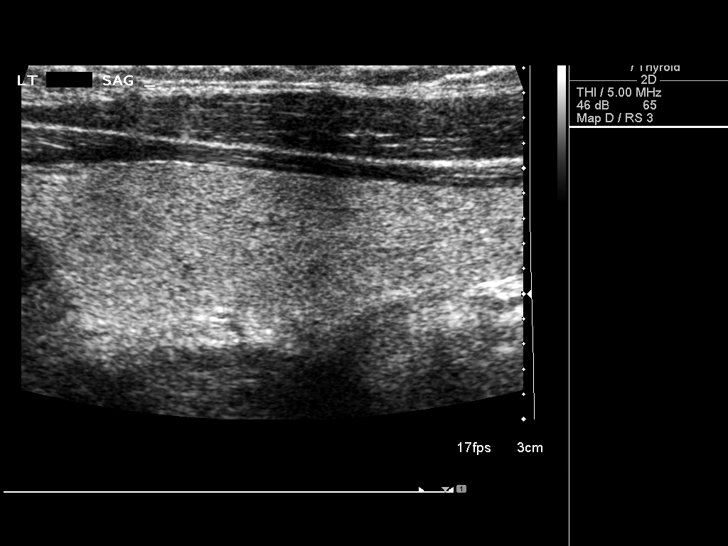
[im 34/34]
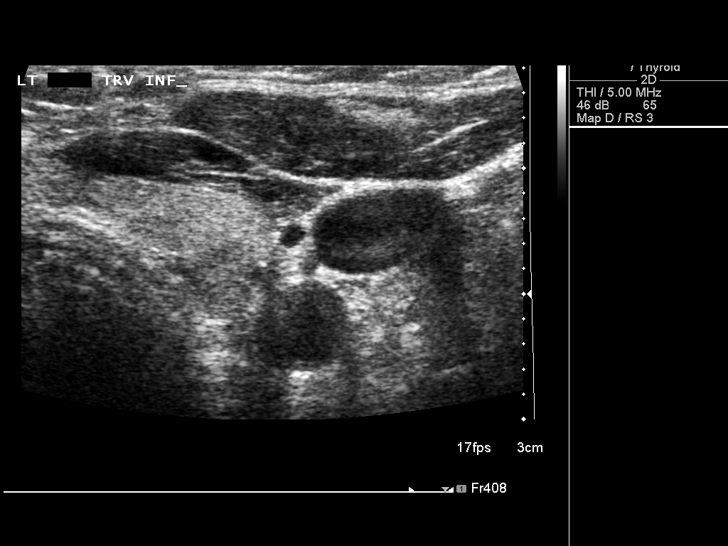

[14 of 25 positions shown; findings below may reference images not displayed]

FINDINGS: Right thyroid lobe:  Measures 5.7 x 1.5 x 2.0 cm.
Left thyroid lobe:  Measures 5.1 x 1.6 x 1.6 cm.
Isthmus:  Measures 0.7 cm.

Homogeneous thyroid echotexture.

Focal nodules:  There is a solid dominant 3.0 x 1.7 x 2.9 cm lesion
in the right thyroid lobe and isthmus.  This has been previously
biopsied and is unchanged in size or appearance since prior study.
A small solid nodule is noted in the lateral aspect of the right
lobe and measures 6 x 4 x 5 mm.  No change.

Lymphadenopathy:  None visualized.
IMPRESSION: Stable mild thyroid gland enlargement and thyroid nodules.

## 2014-08-23 ENCOUNTER — Other Ambulatory Visit: Payer: Managed Care, Other (non HMO)

## 2014-09-15 ENCOUNTER — Ambulatory Visit
Admission: RE | Admit: 2014-09-15 | Discharge: 2014-09-15 | Disposition: A | Discharge status: Home / Self Care | Payer: Managed Care, Other (non HMO) | Source: Ambulatory Visit | Attending: Endocrinology | Admitting: Endocrinology

## 2014-09-15 DIAGNOSIS — E049 Nontoxic goiter, unspecified: Secondary | ICD-10-CM

## 2014-09-23 ENCOUNTER — Encounter: Payer: Self-pay | Admitting: Gastroenterology

## 2014-12-17 ENCOUNTER — Other Ambulatory Visit: Payer: Self-pay

## 2014-12-17 DIAGNOSIS — Z1231 Encounter for screening mammogram for malignant neoplasm of breast: Secondary | ICD-10-CM

## 2015-01-09 ENCOUNTER — Other Ambulatory Visit: Payer: Self-pay | Admitting: Family Medicine

## 2015-01-10 ENCOUNTER — Ambulatory Visit: Payer: Managed Care, Other (non HMO)

## 2015-01-11 ENCOUNTER — Encounter: Payer: Self-pay | Admitting: Family Medicine

## 2015-01-12 MED ORDER — ATORVASTATIN CALCIUM 10 MG PO TABS: 10.0000 mg | ORAL_TABLET | Freq: Every day | ORAL | Status: DC

## 2015-01-12 NOTE — Telephone Encounter (Signed)
I refilled this for one year  

## 2015-01-28 ENCOUNTER — Ambulatory Visit
Admission: RE | Admit: 2015-01-28 | Discharge: 2015-01-28 | Disposition: A | Discharge status: Home / Self Care | Payer: Managed Care, Other (non HMO) | Source: Ambulatory Visit

## 2015-01-28 DIAGNOSIS — Z1231 Encounter for screening mammogram for malignant neoplasm of breast: Secondary | ICD-10-CM

## 2015-02-18 ENCOUNTER — Telehealth: Payer: Self-pay | Admitting: Family Medicine

## 2015-02-18 ENCOUNTER — Encounter: Payer: Self-pay | Admitting: Family Medicine

## 2015-02-18 NOTE — Telephone Encounter (Signed)
Ms. Brandy Proctor called saying she wants Dr. Clent RidgesFry to put in lab orders for [facility] [place] on [address]  She also said she needs medication refills but didn't want to come in yet. She wants to have the labs drawn then have a CPE. Please call the pt regarding this.   Pt ph# [phone] Thank you.

## 2015-02-18 NOTE — Telephone Encounter (Signed)
I spoke with Brandy Proctor and she will send us the fax number later in my chart.

## 2015-02-18 NOTE — Telephone Encounter (Signed)
I wrote a rx for the labs to be faxed over

## 2015-04-12 ENCOUNTER — Telehealth: Payer: Self-pay | Admitting: Family Medicine

## 2015-04-12 NOTE — Telephone Encounter (Signed)
We will fax in another order for the BMET

## 2015-04-12 NOTE — Telephone Encounter (Signed)
I left a voice message for pt to return my call. Pt has labs drawn at [facility] and results are at the desk.

## 2015-04-12 NOTE — Telephone Encounter (Signed)
I faxed a script for lab order to below number.

## 2015-04-12 NOTE — Telephone Encounter (Signed)
I spoke with pt and went over results. Pt would like for [place] to fax a order for the BMET, [facility] said they could not read the order and that is why it was left off. Fax to [phone].

## 2015-09-08 ENCOUNTER — Other Ambulatory Visit: Payer: Self-pay | Admitting: Obstetrics

## 2015-09-08 DIAGNOSIS — Z1231 Encounter for screening mammogram for malignant neoplasm of breast: Secondary | ICD-10-CM

## 2016-01-07 ENCOUNTER — Other Ambulatory Visit: Payer: Self-pay | Admitting: Family Medicine

## 2016-01-10 ENCOUNTER — Telehealth: Payer: Self-pay | Admitting: Family Medicine

## 2016-01-10 NOTE — Telephone Encounter (Signed)
Pt need new Rx for atorvastatin    Pharm:  [facility]

## 2016-01-12 NOTE — Telephone Encounter (Signed)
Pt must schedule a office and I do not see who the primary provider is?

## 2016-01-12 NOTE — Telephone Encounter (Signed)
lmom letting pt know of the message below.

## 2016-01-17 ENCOUNTER — Ambulatory Visit (INDEPENDENT_AMBULATORY_CARE_PROVIDER_SITE_OTHER): Payer: Managed Care, Other (non HMO) | Admitting: Family Medicine

## 2016-01-17 ENCOUNTER — Encounter: Payer: Self-pay | Admitting: Family Medicine

## 2016-01-17 VITALS — BP 138/80 | HR 83 | Resp 12 | Ht 65.5 in | Wt 189.1 lb

## 2016-01-17 DIAGNOSIS — Z23 Encounter for immunization: Secondary | ICD-10-CM | POA: Diagnosis not present

## 2016-01-17 DIAGNOSIS — E559 Vitamin D deficiency, unspecified: Secondary | ICD-10-CM

## 2016-01-17 DIAGNOSIS — E785 Hyperlipidemia, unspecified: Secondary | ICD-10-CM | POA: Diagnosis not present

## 2016-01-17 DIAGNOSIS — Z8249 Family history of ischemic heart disease and other diseases of the circulatory system: Secondary | ICD-10-CM

## 2016-01-17 MED ORDER — ATORVASTATIN CALCIUM 10 MG PO TABS: 10.0000 mg | ORAL_TABLET | Freq: Every day | ORAL | 2 refills | Status: DC

## 2016-01-17 NOTE — Progress Notes (Signed)
HPI:   [name] is a 55 y.o. female, who is here today to establish care with me.  Former PCP: Dr Brandy Proctor Last preventive routine visit: 1-2 years ago. She follows with gynecologists for her female preventive care.  History of hypothyroidism, she follows with Dr. Talmage Proctor, currently she is on levothyroxine 25 g daily.  Concerns today: Lipitor refill.  BP was mildly elevated, improved after 10-15 minutes. No Hx of HTN. She doesn't check BP at home.  Hx of vitamin D deficiency, currently she is on vitamin D supplementation, 800 units daily. In the past she was taking ergocalciferol 50,000 units weekly.   Hyperlipidemia:  Currently on Lipitor 10 mg daily. Following a low fat diet: Yes.  She has not noted side effects with medication.  Lab Results  Component Value Date   CHOL 190 04/05/2014   HDL 57.40 04/05/2014   LDLCALC 105 (H) 04/05/2014   LDLDIRECT 171.8 01/23/2012   TRIG 138.0 04/05/2014   CHOLHDL 3 04/05/2014   She mentions she has strong family history for coronary artery disease, she is reporting cardiac workup done a few years ago as screening. She takes daily aspirin 81 mg.  She tries to follow a healthful diet but she doesn't exercise regularly.     Review of Systems  Constitutional: Negative for activity change, appetite change, fatigue, fever and unexpected weight change.  HENT: Negative for mouth sores, nosebleeds and trouble swallowing.   Eyes: Negative for redness and visual disturbance.  Respiratory: Negative for cough, shortness of breath and wheezing.   Cardiovascular: Negative for chest pain, palpitations and leg swelling.  Gastrointestinal: Negative for abdominal pain, nausea and vomiting.       Negative for changes in bowel habits.  Endocrine: Negative for cold intolerance and heat intolerance.  Genitourinary: Negative for decreased urine volume, difficulty urinating, dysuria and hematuria.  Musculoskeletal: Negative for gait  problem and myalgias.  Neurological: Negative for syncope, weakness, numbness and headaches.  Hematological: Negative for adenopathy. Does not bruise/bleed easily.  Psychiatric/Behavioral: Negative for confusion. The patient is not nervous/anxious.       Current Outpatient Prescriptions on File Prior to Visit  Medication Sig Dispense Refill  . aspirin 81 MG tablet Take 81 mg by mouth daily.      . Calcium Carbonate-Vit D-Min (CALTRATE 600+D PLUS) 600-400 MG-UNIT per tablet Take 1 tablet by mouth daily.      . fish oil-omega-3 fatty acids 1000 MG capsule Take 2 g by mouth daily.      . folic acid (FOLVITE) 1 MG tablet Take 1 tablet by mouth daily.    Marland Kitchen. levonorgestrel (MIRENA) 20 MCG/24HR IUD 1 each by Intrauterine route once.    Marland Kitchen. levothyroxine (SYNTHROID, LEVOTHROID) 25 MCG tablet Take 1 tablet by mouth daily.    . Multiple Vitamin (MULTIVITAMIN) tablet Take 1 tablet by mouth daily.       No current facility-administered medications on file prior to visit.      Past Medical History:  Diagnosis Date  . Hyperlipidemia    Allergies  Allergen Reactions  . Penicillins     REACTION: rash    Family History  Problem Relation Age of Onset  . Hyperlipidemia Mother   . Cancer Mother     uterine   . Heart disease Father     CABG  . Hyperlipidemia Father   . Heart disease Brother [id number]    CAD    Social History   Social History  .  Marital status: Married    Spouse name: N/A  . Number of children: N/A  . Years of education: N/A   Social History Main Topics  . Smoking status: Never Smoker  . Smokeless tobacco: Never Used  . Alcohol use 0.0 oz/week     Comment: occ  . Drug use: No  . Sexual activity: Not Asked   Other Topics Concern  . None   Social History Narrative  . None    Vitals:   01/17/16 1458  BP: 138/80  Pulse: 83  Resp: 12   O2 sat at RA 97%  Body mass index is 30.99 kg/m.    Physical Exam  Nursing note and vitals reviewed. Constitutional: She  is oriented to person, place, and time. She appears well-developed. No distress.  HENT:  Head: Atraumatic.  Mouth/Throat: Oropharynx is clear and moist and mucous membranes are normal.  Eyes: Conjunctivae and EOM are normal. Pupils are equal, round, and reactive to light.  Neck: No tracheal deviation present. Thyromegaly present. No thyroid mass present.  Cardiovascular: Normal rate and regular rhythm.   No murmur heard. Pulses:      Dorsalis pedis pulses are 2+ on the right side, and 2+ on the left side.  Respiratory: Effort normal and breath sounds normal. No respiratory distress.  GI: Soft. She exhibits no mass. There is no hepatomegaly. There is no tenderness.  Musculoskeletal: She exhibits no edema or tenderness.  Neurological: She is alert and oriented to person, place, and time. She has normal strength. Coordination and gait normal.  Skin: Skin is warm. No erythema.  Psychiatric: She has a normal mood and affect.  Well groomed, good eye contact.      ASSESSMENT AND PLAN:     Brandy Proctor was seen today for establish care.  Diagnoses and all orders for this visit:    Hyperlipidemia, unspecified hyperlipidemia type  Tolerating medication well. Continue low fat diet and no changes in Lipitor dose. F/U in 3-4 months.  -     atorvastatin (LIPITOR) 10 MG tablet; Take 1 tablet (10 mg total) by mouth daily.  Vitamin D deficiency  No changes in current management, will follow labs done today and will give further recommendations accordingly.  -     VITAMIN D 25 Hydroxy (Vit-D Deficiency, Fractures)  Family history of early CAD  We discussed current recommendations in regard to prevention/screening for CAD. I recommend continuing with aspirin 81 mg and statin, engage in regular physical activity, and continue a healthful diet. Since she is asymptomatic I don't think she needs further cardiac workup but explained that we need to be vigilant given her FHx.  Need for  immunization against influenza -     Flu Vaccine QUAD 36+ mos IM       Brandy Proctor G. [place]Jordan, MD  [facility]. [place] office.

## 2016-01-17 NOTE — Patient Instructions (Addendum)
A few things to remember from today's visit:   Hyperlipidemia, unspecified hyperlipidemia type - Plan: atorvastatin (LIPITOR) 10 MG tablet  Vitamin D deficiency - Plan: VITAMIN D 25 Hydroxy (Vit-D Deficiency, Fractures)   Brandy Proctor, today we have followed on some of your chronic medical problems and they seem to be stable, so no changes in current management today.  Review medication list and be sure if is accurate.  -Remember a healthy diet and regular physical activity are very important for prevention as well as for well being; they also help with many chronic problems, decreasing the need of adding new medications and delaying or preventing possible complications.  Monitor blood pressure at home.   Remember to arrange your follow up appt before leaving today. Please follow sooner than planned if a new concern arises.

## 2016-01-17 NOTE — Progress Notes (Signed)
Pre visit review using our clinic review tool, if applicable. No additional management support is needed unless otherwise documented below in the visit note. 

## 2016-01-18 ENCOUNTER — Encounter: Payer: Self-pay | Admitting: Family Medicine

## 2016-01-18 LAB — VITAMIN D 25 HYDROXY (VIT D DEFICIENCY, FRACTURES): VITD: 18.22 ng/mL — ABNORMAL LOW (ref 30.00–100.00)

## 2016-01-22 ENCOUNTER — Encounter: Payer: Self-pay | Admitting: Family Medicine

## 2016-01-30 ENCOUNTER — Ambulatory Visit: Payer: Managed Care, Other (non HMO)

## 2016-02-03 ENCOUNTER — Ambulatory Visit
Admission: RE | Admit: 2016-02-03 | Discharge: 2016-02-03 | Disposition: A | Discharge status: Home / Self Care | Payer: Managed Care, Other (non HMO) | Source: Ambulatory Visit | Attending: Obstetrics | Admitting: Obstetrics

## 2016-02-03 DIAGNOSIS — Z1231 Encounter for screening mammogram for malignant neoplasm of breast: Secondary | ICD-10-CM

## 2016-02-13 ENCOUNTER — Other Ambulatory Visit: Payer: Self-pay | Admitting: Obstetrics

## 2016-02-13 DIAGNOSIS — R928 Other abnormal and inconclusive findings on diagnostic imaging of breast: Secondary | ICD-10-CM

## 2016-02-17 ENCOUNTER — Ambulatory Visit
Admission: RE | Admit: 2016-02-17 | Discharge: 2016-02-17 | Disposition: A | Discharge status: Home / Self Care | Payer: Managed Care, Other (non HMO) | Source: Ambulatory Visit | Attending: Obstetrics | Admitting: Obstetrics

## 2016-02-17 DIAGNOSIS — R928 Other abnormal and inconclusive findings on diagnostic imaging of breast: Secondary | ICD-10-CM

## 2016-03-13 IMAGING — US US SOFT TISSUE HEAD/NECK
1 series · 14 of 25 positions shown · non-contrast
Comparison: 11/13/2011

CLINICAL DATA: Follow-up thyroid nodules.

EXAM:
THYROID ULTRASOUND
TECHNIQUE: Ultrasound examination of the thyroid gland and adjacent soft
tissues was performed.

[Series 1: us soft tissue head/neck · 0.09mm/px · 14 of 60 slices shown]
[im 1/60]
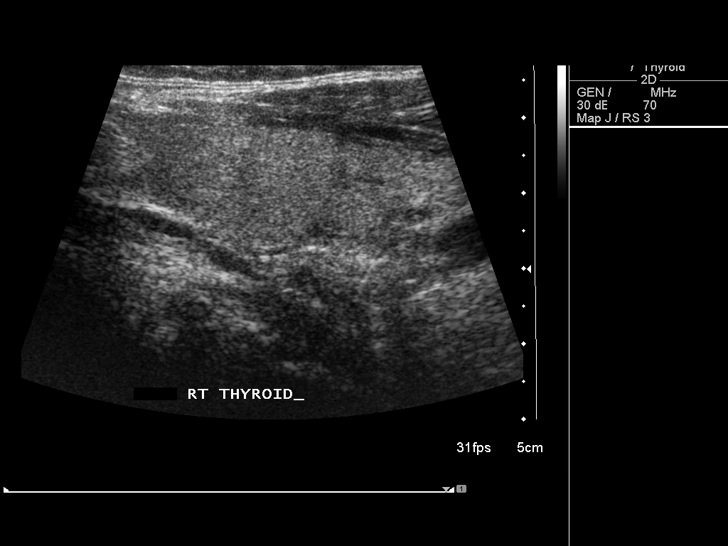
[im 5/60]
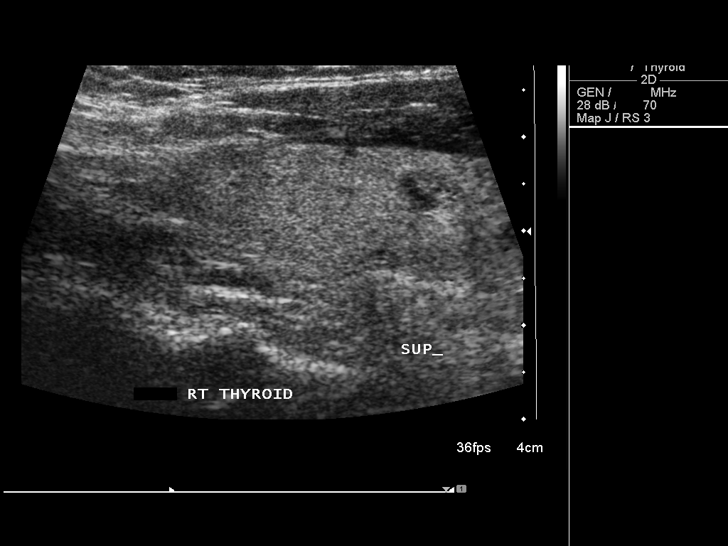
[im 10/60]
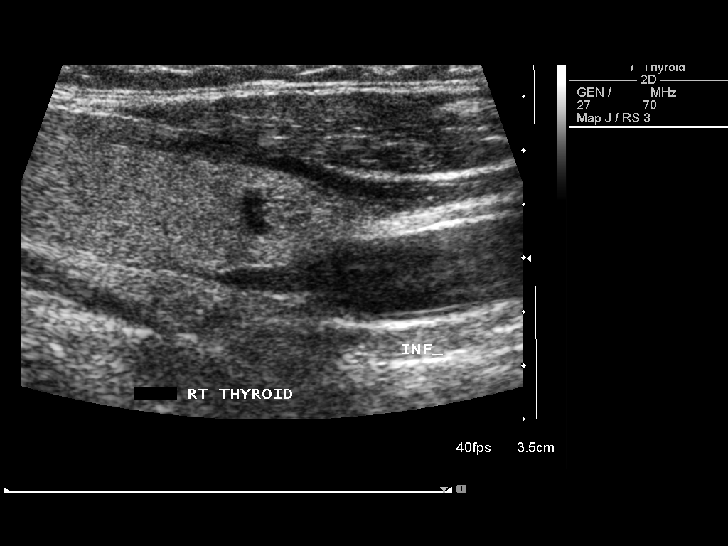
[im 15/60]
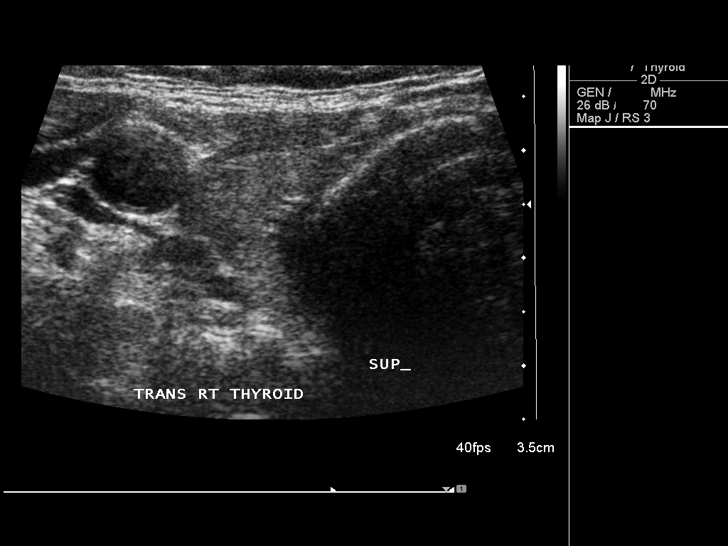
[im 20/60]
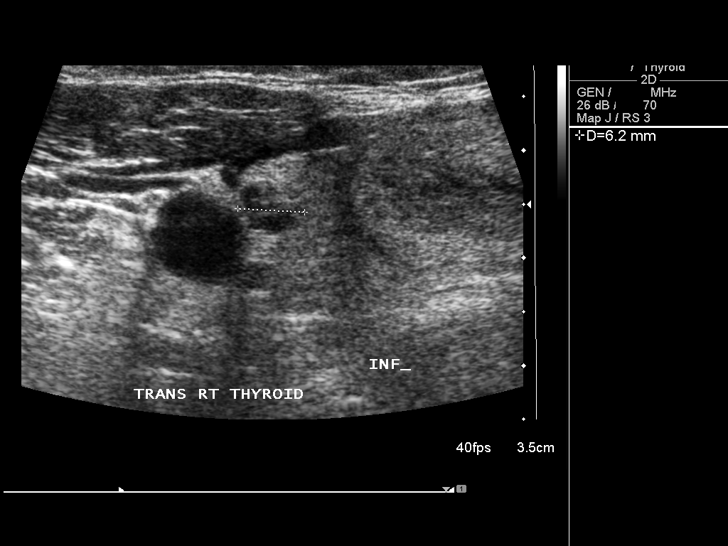
[im 23/60]
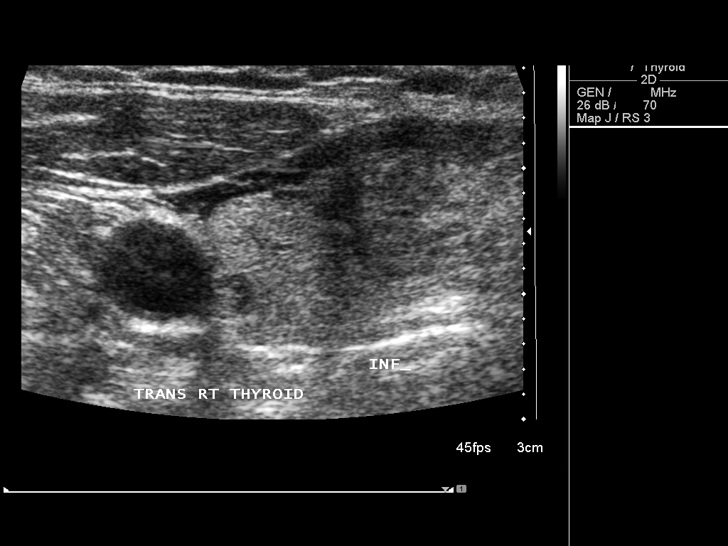
[im 28/60]
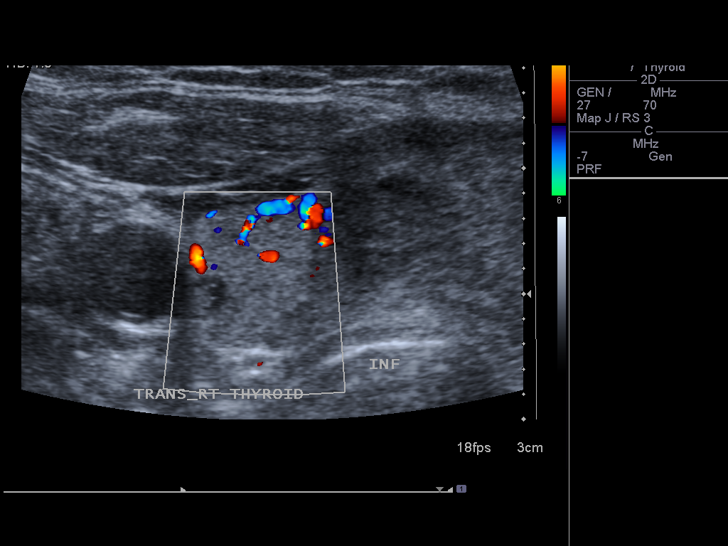
[im 32/60]
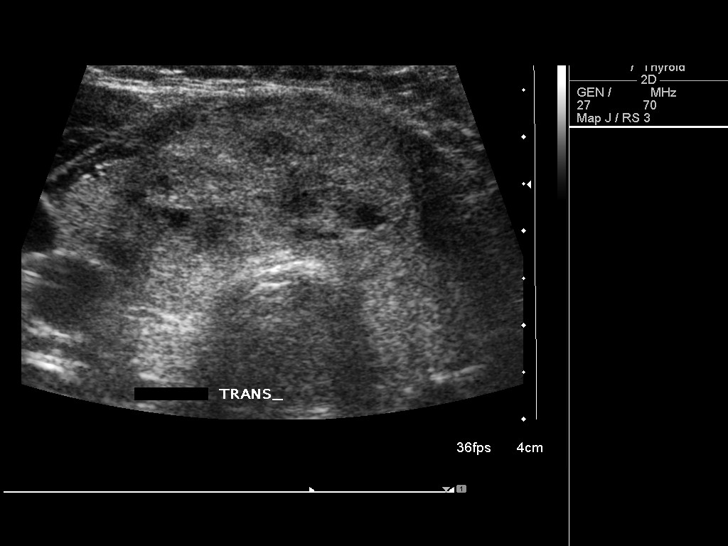
[im 37/60]
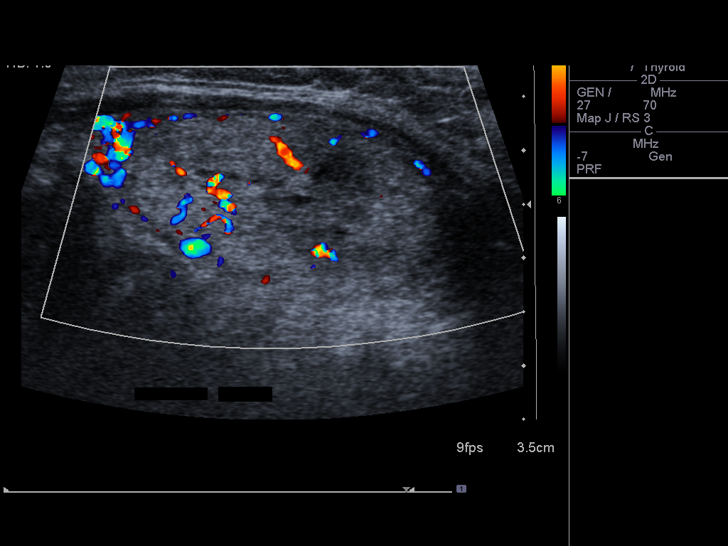
[im 40/60]
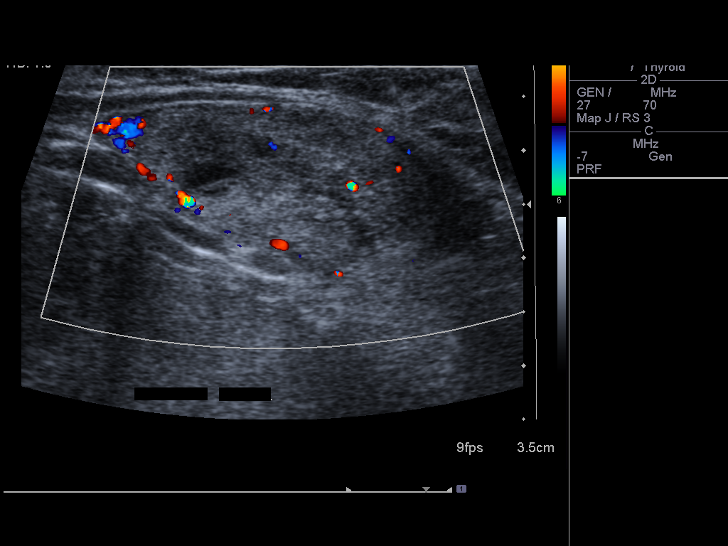
[im 45/60]
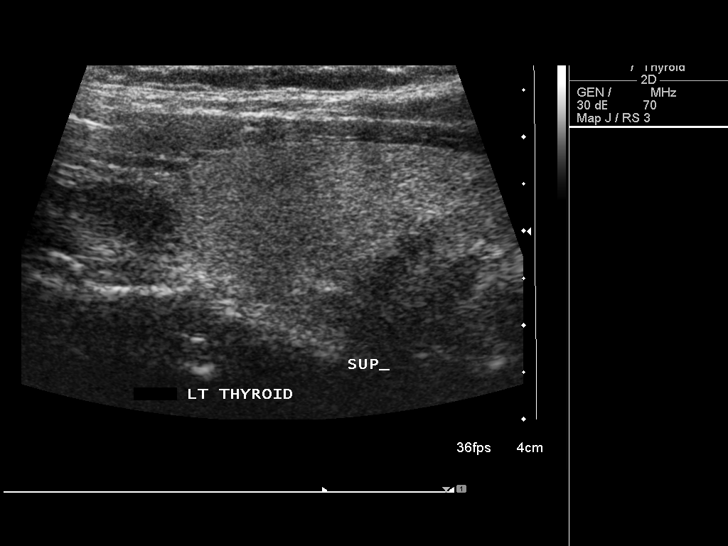
[im 50/60]
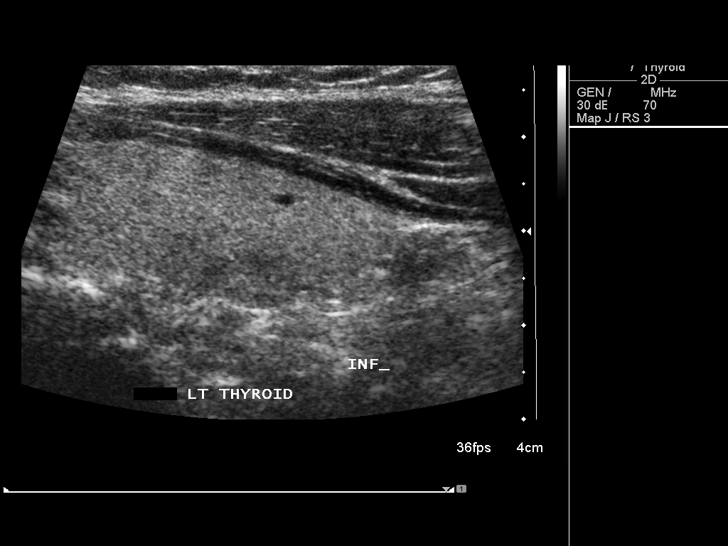
[im 55/60]
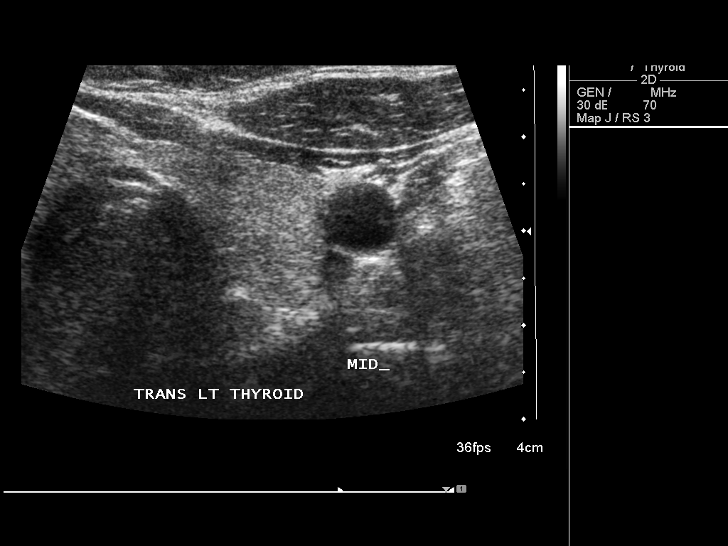
[im 60/60]
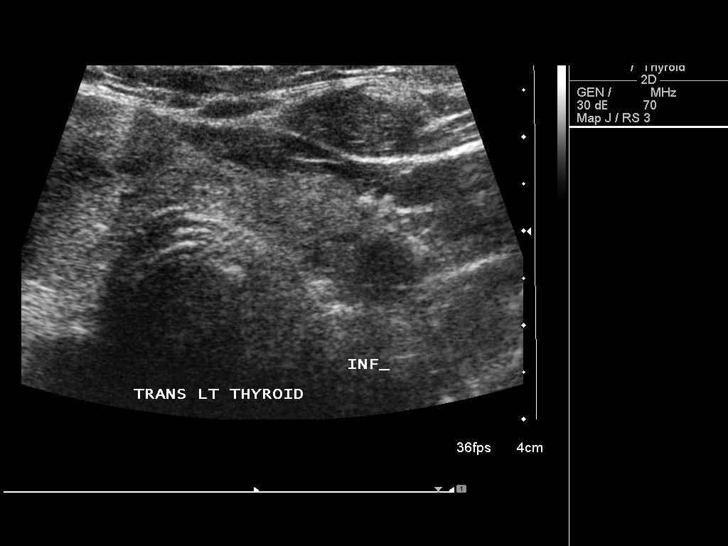

[14 of 25 positions shown; findings below may reference images not displayed]

FINDINGS: Right thyroid lobe

Measurements: 4.6 x 1.7 x 1.7 cm. Complex nodule in the inferior
right thyroid lobe measures 0.8 x 0.5 x 0.6 cm. This nodule
previously measured 0.6 x 0.4 x 0.5 cm. There is a second nodule in
the inferior right thyroid lobe that measures 0.5 x 0.4 x 0.4 cm.
This nodule was not well visualized on the previous examination but
it may have been present in retrospect.

Left thyroid lobe

Measurements: 5.1 x 2.0 x 1.5 cm.  No nodules visualized.

Isthmus

Thickness: 1.0 cm.. There is a heterogeneous nodule involving the
isthmus that measures 3.0 x 1.5 x 3.3 cm. Previously, this nodule
measured 2.9 x 1.7 x 3.0 cm. The echotexture of this lesion has not
changed.

Lymphadenopathy

None visualized.
IMPRESSION: Minimal change in the size of the dominant isthmus nodule.

Small right thyroid nodules.

## 2016-04-06 ENCOUNTER — Encounter: Payer: Self-pay | Admitting: Family Medicine

## 2016-04-09 ENCOUNTER — Telehealth: Payer: Self-pay | Admitting: Family Medicine

## 2016-04-09 NOTE — Telephone Encounter (Signed)
Pt works in [place], [place] during the week and would like to know if we can send her CPE  lab order to  [facility] [place]. Pt has a cpe 04/17/16 and this would help her out very much.  [facility] fax:  [phone] [facility] phone:  [phone]

## 2016-04-09 NOTE — Telephone Encounter (Signed)
I do not think I ordered labs. She has a follow up appt in a few days here, I usually order labs after OV.  Can you please verify which labs she is referring to. Thanks, BJ

## 2016-04-09 NOTE — Telephone Encounter (Signed)
Okay to send lab orders? If so, which labs?

## 2016-04-10 ENCOUNTER — Encounter: Payer: Self-pay | Admitting: Family Medicine

## 2016-04-10 NOTE — Telephone Encounter (Signed)
Left voicemail letting patient know that labs will be ordered the same day as her physical. Advised to call back with any questions and also sent mychart message.

## 2016-04-12 ENCOUNTER — Other Ambulatory Visit: Payer: Managed Care, Other (non HMO)

## 2016-04-17 ENCOUNTER — Ambulatory Visit (INDEPENDENT_AMBULATORY_CARE_PROVIDER_SITE_OTHER): Payer: Managed Care, Other (non HMO) | Admitting: Family Medicine

## 2016-04-17 ENCOUNTER — Encounter: Payer: Self-pay | Admitting: Family Medicine

## 2016-04-17 VITALS — BP 130/90 | HR 78 | Resp 12 | Ht 65.5 in | Wt 180.0 lb

## 2016-04-17 DIAGNOSIS — E559 Vitamin D deficiency, unspecified: Secondary | ICD-10-CM | POA: Diagnosis not present

## 2016-04-17 DIAGNOSIS — Z0000 Encounter for general adult medical examination without abnormal findings: Secondary | ICD-10-CM | POA: Diagnosis not present

## 2016-04-17 DIAGNOSIS — E041 Nontoxic single thyroid nodule: Secondary | ICD-10-CM | POA: Diagnosis not present

## 2016-04-17 DIAGNOSIS — Z1159 Encounter for screening for other viral diseases: Secondary | ICD-10-CM

## 2016-04-17 DIAGNOSIS — E7800 Pure hypercholesterolemia, unspecified: Secondary | ICD-10-CM

## 2016-04-17 LAB — URINALYSIS, ROUTINE W REFLEX MICROSCOPIC
BILIRUBIN URINE: NEGATIVE
KETONES UR: NEGATIVE
LEUKOCYTES UA: NEGATIVE
NITRITE: NEGATIVE
Specific Gravity, Urine: 1.02 (ref 1.000–1.030)
Total Protein, Urine: NEGATIVE
URINE GLUCOSE: NEGATIVE
UROBILINOGEN UA: 0.2 (ref 0.0–1.0)
WBC UA: NONE SEEN (ref 0–?)
pH: 5.5 (ref 5.0–8.0)

## 2016-04-17 LAB — LIPID PANEL
Cholesterol: 204 mg/dL — ABNORMAL HIGH (ref 0–200)
HDL: 60.9 mg/dL (ref >39.00)
LDL Cholesterol: 120 mg/dL — ABNORMAL HIGH (ref 0–99)
NONHDL: 143.15
TRIGLYCERIDES: 118 mg/dL (ref 0.0–149.0)
Total CHOL/HDL Ratio: 3
VLDL: 23.6 mg/dL (ref 0.0–40.0)

## 2016-04-17 LAB — COMPREHENSIVE METABOLIC PANEL
ALK PHOS: 105 U/L (ref 39–117)
ALT: 15 U/L (ref 0–35)
AST: 21 U/L (ref 0–37)
Albumin: 4.9 g/dL (ref 3.5–5.2)
BUN: 14 mg/dL (ref 6–23)
CALCIUM: 10.4 mg/dL (ref 8.4–10.5)
CO2: 31 mEq/L (ref 19–32)
Chloride: 104 mEq/L (ref 96–112)
Creatinine, Ser: 0.83 mg/dL (ref 0.40–1.20)
GFR: 75.06 mL/min (ref >60.00)
GLUCOSE: 94 mg/dL (ref 70–99)
POTASSIUM: 4.2 meq/L (ref 3.5–5.1)
Sodium: 141 mEq/L (ref 135–145)
TOTAL PROTEIN: 7.2 g/dL (ref 6.0–8.3)
Total Bilirubin: 0.5 mg/dL (ref 0.2–1.2)

## 2016-04-17 LAB — TSH: TSH: 1.58 u[IU]/mL (ref 0.35–4.50)

## 2016-04-17 LAB — VITAMIN D 25 HYDROXY (VIT D DEFICIENCY, FRACTURES): VITD: 27.76 ng/mL — ABNORMAL LOW (ref 30.00–100.00)

## 2016-04-17 NOTE — Patient Instructions (Addendum)
A few things to remember from today's visit:   Routine general medical examination at a health care facility - Plan: Comprehensive metabolic panel, Lipid panel, TSH, Hepatitis C antibody screen, Urinalysis, Routine w reflex microscopic  Pure hypercholesterolemia  THYROID NODULE  Vitamin D deficiency - Plan: VITAMIN D 25 Hydroxy (Vit-D Deficiency, Fractures)    At least 150 minutes of moderate exercise per week, daily brisk walking for 15-30 min is a good exercise option. Healthy diet low in saturated (animal) fats and sweets and consisting of fresh fruits and vegetables, lean meats such as fish and white chicken and whole grains.  Monitor blood pressure at home if possible, bottom number borderline.   - Vaccines:  Tdap vaccine every 10 years.  Shingles vaccine recommended at age 58, could be given after 58 years of age but not sure about insurance coverage.  Pneumonia vaccines:  Prevnar 13 at 65 and Pneumovax at 66.  Screening recommendations for low/normal risk women:  Screening for diabetes at age 58-45 and every 3 years.  Cervical cancer prevention:  -HPV vaccination between 589-58 years old. -Pap smear starts at 58 years of age and continues periodically until 58 years old in low risk women. Pap smear every 3 years between 7721 and 58 years old. Pap smear every 3 years between women 30 and older if pap smear negative and HPV screening negative.   -Breast cancer: Mammogram: There is disagreement between experts about when to start screening in low risk asymptomatic female but recent recommendations are to start screening at 840 and not later than 58 years old , every 1-2 years and after 58 yo q 2 years. Screening is recommended until 58 years old but some women can continue screening depending of healthy issues.   Colon cancer screening: starts at 58 years old until 58 years old.  Cholesterol disorder screening at age 58 and every 3 years.    Please be sure medication  list is accurate. If a new problem present, please set up appointment sooner than planned today.

## 2016-04-17 NOTE — Progress Notes (Signed)
Pre visit review using our clinic review tool, if applicable. No additional management support is needed unless otherwise documented below in the visit note. 

## 2016-04-17 NOTE — Progress Notes (Signed)
HPI:   [name] is a 56 y.o. female, who is here today for her routine physical.  Regular exercise 3 or more time per week: Not currently Following a healthy diet: Yes, following a low carb diet. She lives with diet.  Chronic medical problems: HLD, thyroid nodule. She follows with dermatologists annually and endocrinologists.  She follows with gyn, pap smear and mammogram reported as current. Colonoscopy: 2010.   FHx for gynecologic: Mother uterine cancer. FHx for colon cancer negative.  She has no concerns today.   Hep C screening: Denies risk factors. Hx of vit D deficiency , she is on OTC Vit D supplementation, 2000 U daily. HLD: She is on Lipitor 10 mg daily, tolerating well. She has not been consistent with los fat deit in the past few months.    Review of Systems  Constitutional: Negative for appetite change, fatigue and fever.  HENT: Negative for dental problem, hearing loss, mouth sores, trouble swallowing and voice change.   Eyes: Negative for photophobia, pain and visual disturbance.  Respiratory: Negative for cough, shortness of breath and wheezing.   Cardiovascular: Negative for chest pain and leg swelling.  Gastrointestinal: Negative for abdominal pain, nausea and vomiting.       No changes in bowel habits.  Endocrine: Negative for cold intolerance, heat intolerance, polydipsia, polyphagia and polyuria.  Genitourinary: Negative for decreased urine volume, dysuria and hematuria.  Musculoskeletal: Negative for back pain, gait problem, myalgias and neck pain.  Skin: Negative for color change and rash.  Neurological: Negative for syncope, weakness, numbness and headaches.  Hematological: Negative for adenopathy. Does not bruise/bleed easily.  Psychiatric/Behavioral: Negative for confusion and sleep disturbance.  All other systems reviewed and are negative.     Current Outpatient Prescriptions on File Prior to Visit  Medication Sig Dispense  Refill  . aspirin 81 MG tablet Take 81 mg by mouth daily.      Marland Kitchen atorvastatin (LIPITOR) 10 MG tablet Take 1 tablet (10 mg total) by mouth daily. 90 tablet 2  . Biotin 5000 MCG CAPS Take 1 capsule by mouth daily.    . Calcium Carbonate-Vit D-Min (CALTRATE 600+D PLUS) 600-400 MG-UNIT per tablet Take 1 tablet by mouth daily.      . fish oil-omega-3 fatty acids 1000 MG capsule Take 2 g by mouth daily.      . folic acid (FOLVITE) 1 MG tablet Take 1 tablet by mouth daily.    Marland Kitchen levonorgestrel (MIRENA) 20 MCG/24HR IUD 1 each by Intrauterine route once.    Marland Kitchen levothyroxine (SYNTHROID, LEVOTHROID) 25 MCG tablet Take 1 tablet by mouth daily.    . Multiple Vitamin (MULTIVITAMIN) tablet Take 1 tablet by mouth daily.      . Selenium 100 MCG TABS Take 1 tablet by mouth daily.     No current facility-administered medications on file prior to visit.      Past Medical History:  Diagnosis Date  . Hyperlipidemia     Allergies  Allergen Reactions  . Penicillins     REACTION: rash    Family History  Problem Relation Age of Onset  . Hyperlipidemia Mother   . Cancer Mother     uterine   . Heart disease Father     CABG  . Hyperlipidemia Father   . Heart disease Brother 64    CAD    Social History   Social History  . Marital status: Married    Spouse name: N/A  . Number  of children: N/A  . Years of education: N/A   Social History Main Topics  . Smoking status: Never Smoker  . Smokeless tobacco: Never Used  . Alcohol use 0.0 oz/week     Comment: occ  . Drug use: No  . Sexual activity: Not Asked   Other Topics Concern  . None   Social History Narrative  . None     Vitals:   04/17/16 0752  BP: 130/90  Pulse: 78  Resp: 12   Body mass index is 29.5 kg/m.  O2 sat at RA 96%  Wt Readings from Last 3 Encounters:  04/17/16 180 lb (81.6 kg)  01/17/16 189 lb 2 oz (85.8 kg)  04/05/14 177 lb (80.3 kg)    Physical Exam  Nursing note and vitals reviewed. Constitutional: She is  oriented to person, place, and time. She appears well-developed. No distress.  HENT:  Head: Atraumatic.  Right Ear: Hearing, tympanic membrane, external ear and ear canal normal.  Left Ear: Hearing, tympanic membrane, external ear and ear canal normal.  Mouth/Throat: Uvula is midline, oropharynx is clear and moist and mucous membranes are normal.  Eyes: Conjunctivae and EOM are normal. Pupils are equal, round, and reactive to light.  Neck: No tracheal deviation present. Thyromegaly present.  Cardiovascular: Normal rate and regular rhythm.   No murmur heard. Pulses:      Dorsalis pedis pulses are 2+ on the right side, and 2+ on the left side.  Respiratory: Effort normal and breath sounds normal. No respiratory distress.  GI: Soft. She exhibits no mass. There is no hepatomegaly. There is no tenderness.  Musculoskeletal: She exhibits no edema.  No major deformity or signs of synovitis appreciated.  Lymphadenopathy:    She has no cervical adenopathy.       Right: No supraclavicular adenopathy present.       Left: No supraclavicular adenopathy present.  Neurological: She is alert and oriented to person, place, and time. She has normal strength. No cranial nerve deficit. Coordination and gait normal.  Reflex Scores:      Bicep reflexes are 2+ on the right side and 2+ on the left side.      Patellar reflexes are 2+ on the right side and 2+ on the left side. Skin: Skin is warm. No rash noted. No erythema.  Psychiatric: She has a normal mood and affect. Her speech is normal.  Well groomed, good eye contact.      ASSESSMENT AND PLAN:   Brandy Proctor was seen today for annual exam.  Diagnoses and all orders for this visit:  Lab Results  Component Value Date   CHOL 204 (H) 04/17/2016   HDL 60.90 04/17/2016   LDLCALC 120 (H) 04/17/2016   LDLDIRECT 171.8 01/23/2012   TRIG 118.0 04/17/2016   CHOLHDL 3 04/17/2016     Chemistry      Component Value Date/Time   NA 141 04/17/2016 0835   K 4.2  04/17/2016 0835   CL 104 04/17/2016 0835   CO2 31 04/17/2016 0835   BUN 14 04/17/2016 0835   CREATININE 0.83 04/17/2016 0835      Component Value Date/Time   CALCIUM 10.4 04/17/2016 0835   ALKPHOS 105 04/17/2016 0835   AST 21 04/17/2016 0835   ALT 15 04/17/2016 0835   BILITOT 0.5 04/17/2016 0835     Lab Results  Component Value Date   TSH 1.58 04/17/2016    Routine general medical examination at a health care facility   We discussed  the importance of regular physical activity and healthy diet for prevention of chronic illness and/or complications. Preventive guidelines reviewed. Continue following with gyn for her routine pap and mammogram.  Vaccination up to date. Aspirin for primary prevention discussed and recommended. Ca++ and vit D supplementation recommended. Next CPE in 1 year.   -     Comprehensive metabolic panel -     Lipid panel -     TSH -     Hepatitis C antibody screen -     Urinalysis, Routine w reflex microscopic  Pure hypercholesterolemia  No changes in current management, will follow labs done today and will give further recommendations accordingly. F/U in 6-12 months.  -     Lipid panel  THYROID NODULE  Stable. Asymptomatic.  -     TSH  Vitamin D deficiency  No changes in current management, will follow labs done today and will give further recommendations accordingly.  -     VITAMIN D 25 Hydroxy (Vit-D Deficiency, Fractures)  Encounter for hepatitis C screening test for low risk patient -     Hepatitis C antibody screen     Return in 1 year (on 04/17/2017) for routine.       Betty G. [place]Jordan, MD  [facility]. [place] office.

## 2016-04-18 LAB — HEPATITIS C ANTIBODY: HCV AB: NEGATIVE

## 2016-04-19 ENCOUNTER — Encounter: Payer: Self-pay | Admitting: Family Medicine

## 2016-08-01 ENCOUNTER — Encounter: Payer: Self-pay | Admitting: Family Medicine

## 2016-08-01 ENCOUNTER — Ambulatory Visit (INDEPENDENT_AMBULATORY_CARE_PROVIDER_SITE_OTHER): Payer: BLUE CROSS/BLUE SHIELD | Admitting: Family Medicine

## 2016-08-01 VITALS — BP 128/88 | HR 85 | Temp 98.5°F | Ht 65.5 in | Wt 182.2 lb

## 2016-08-01 DIAGNOSIS — J329 Chronic sinusitis, unspecified: Secondary | ICD-10-CM | POA: Diagnosis not present

## 2016-08-01 DIAGNOSIS — B9789 Other viral agents as the cause of diseases classified elsewhere: Secondary | ICD-10-CM

## 2016-08-01 DIAGNOSIS — J029 Acute pharyngitis, unspecified: Secondary | ICD-10-CM | POA: Diagnosis not present

## 2016-08-01 LAB — POCT RAPID STREP A (OFFICE): Rapid Strep A Screen: NEGATIVE

## 2016-08-01 MED ORDER — PREDNISONE 20 MG PO TABS: ORAL_TABLET | ORAL | 0 refills | Status: AC

## 2016-08-01 NOTE — Patient Instructions (Signed)
Results for orders placed or performed in visit on 08/01/16 (from the past 24 hour(s))  POCT rapid strep A     Status: None   Collection Time: 08/01/16  8:59 AM  Result Value Ref Range   Rapid Strep A Screen Negative Negative   Sinsusitis Viral based on <10 days, no double sickening, lack of severity of symptoms in first 3 days. Educated on signs that bacterial infection may have developed (symptoms over 10 days, double sickening).   Treatment: -considered steroid: we opted in- trial course of prednisone to reduce inflammation -other symptomatic care with mucinex. Can schedule tylenol for throat discomfort -Antibiotic indicated: not at this time- but would be strongly considered if she fails to improve on prednisone course  Finally, we reviewed reasons to return to care including if symptoms worsen or persist or new concerns arise (particularly fever or shortness of breath)  Meds ordered this encounter  Medications  . predniSONE (DELTASONE) 20 MG tablet    Sig: Take 1 tablet by mouth daily for 5 days, then 1/2 tablet daily for 2 days    Dispense:  6 tablet    Refill:  0

## 2016-08-01 NOTE — Progress Notes (Signed)
PCP: [place]il[place]Jordan, Betty G, MD  Subjective:  Brandy Proctor is a 56 y.o. year old very pleasant female patient who presents with sinusitis symptoms including nasal congestion, sinus tenderness -other symptoms include: sore throat which seems to be one of most prominent symptoms. Of note does not have tonsils. Has some ear pressure and fullness. Mild pain in upper teeth. Mild cough.  -day of illness: 3 -Symptoms show no change -previous treatments: mucinex -sick contacts/travel/risks: denies flu exposure. No known strep contacts either.  -Hx of: allergies- but none in recent years. About a decade ago had a sinus infection yearly  ROS-denies fever, SOB, NVD, tooth pain  Pertinent Past Medical History-  Patient Active Problem List   Diagnosis Date Noted  . Vitamin D deficiency 01/17/2016  . HEMATURIA UNSPECIFIED 08/26/2009  . THYROID NODULE 08/20/2007  . Hyperlipidemia 08/20/2007    Medications- reviewed  Current Outpatient Prescriptions  Medication Sig Dispense Refill  . aspirin 81 MG tablet Take 81 mg by mouth daily.      Marland Kitchen. atorvastatin (LIPITOR) 10 MG tablet Take 1 tablet (10 mg total) by mouth daily. 90 tablet 2  . Biotin 5000 MCG CAPS Take 1 capsule by mouth daily.    . Calcium Carbonate-Vit D-Min (CALTRATE 600+D PLUS) 600-400 MG-UNIT per tablet Take 1 tablet by mouth daily.      . fish oil-omega-3 fatty acids 1000 MG capsule Take 2 g by mouth daily.      . folic acid (FOLVITE) 1 MG tablet Take 1 tablet by mouth daily.    Marland Kitchen. levonorgestrel (MIRENA) 20 MCG/24HR IUD 1 each by Intrauterine route once.    Marland Kitchen. levothyroxine (SYNTHROID, LEVOTHROID) 25 MCG tablet Take 1 tablet by mouth daily.    . Multiple Vitamin (MULTIVITAMIN) tablet Take 1 tablet by mouth daily.      . Selenium 100 MCG TABS Take 1 tablet by mouth daily.    . predniSONE (DELTASONE) 20 MG tablet Take 1 tablet by mouth daily for 5 days, then 1/2 tablet daily for 2 days 6 tablet 0   No current facility-administered medications  for this visit.     Objective: BP 128/88 (BP Location: Left Arm, Patient Position: Sitting, Cuff Size: Large)   Pulse 85   Temp 98.5 F (36.9 C) (Oral)   Ht 5' 5.5" (1.664 m)   Wt 182 lb 3.2 oz (82.6 kg)   SpO2 95%   BMI 29.86 kg/m  Gen: NAD, resting comfortably HEENT: Turbinates erythematous with yellow drainage, TM normal, pharynx mildly erythematous with no tonsils present, bilateral maxillary sinus tenderness CV: RRR no murmurs rubs or gallops Lungs: CTAB no crackles, wheeze, rhonchi Ext: no edema Skin: warm, dry, no rash Neuro: grossly normal, moves all extremities  Results for orders placed or performed in visit on 08/01/16 (from the past 24 hour(s))  POCT rapid strep A     Status: None   Collection Time: 08/01/16  8:59 AM  Result Value Ref Range   Rapid Strep A Screen Negative Negative   Assessment/Plan:  Sinsusitis Viral based on <10 days, no double sickening, lack of severity of symptoms in first 3 days. Educated on signs that bacterial infection may have developed (symptoms over 10 days, double sickening). Patient specifically requested strep testing due to sore throat despite advising centor -1. Her testing was negative as expected  Treatment: -considered steroid: we opted in- trial course of prednisone to reduce inflammation -other symptomatic care with mucinex. Can schedule tylenol for throat discomfort -Antibiotic indicated: not  at this time- but would be strongly considered if she fails to improve on prednisone course  Finally, we reviewed reasons to return to care including if symptoms worsen or persist or new concerns arise (particularly fever or shortness of breath)  Meds ordered this encounter  Medications  . predniSONE (DELTASONE) 20 MG tablet    Sig: Take 1 tablet by mouth daily for 5 days, then 1/2 tablet daily for 2 days    Dispense:  6 tablet    Refill:  0    Tana Conch, MD

## 2016-08-07 ENCOUNTER — Other Ambulatory Visit: Payer: Self-pay | Admitting: Endocrinology

## 2016-08-07 DIAGNOSIS — E049 Nontoxic goiter, unspecified: Secondary | ICD-10-CM

## 2016-09-26 ENCOUNTER — Encounter: Payer: Self-pay | Admitting: Family Medicine

## 2016-09-26 ENCOUNTER — Other Ambulatory Visit: Payer: Self-pay

## 2016-09-26 DIAGNOSIS — E785 Hyperlipidemia, unspecified: Secondary | ICD-10-CM

## 2016-09-26 MED ORDER — ATORVASTATIN CALCIUM 10 MG PO TABS: 10.0000 mg | ORAL_TABLET | Freq: Every day | ORAL | 2 refills | Status: AC

## 2016-10-04 ENCOUNTER — Other Ambulatory Visit: Payer: BLUE CROSS/BLUE SHIELD

## 2016-10-10 ENCOUNTER — Ambulatory Visit
Admission: RE | Admit: 2016-10-10 | Discharge: 2016-10-10 | Disposition: A | Discharge status: Home / Self Care | Payer: 59 | Source: Ambulatory Visit | Attending: Endocrinology | Admitting: Endocrinology

## 2016-10-10 DIAGNOSIS — E049 Nontoxic goiter, unspecified: Secondary | ICD-10-CM

## 2016-12-06 ENCOUNTER — Encounter: Payer: Self-pay | Admitting: Family Medicine

## 2017-03-13 IMAGING — US US SOFT TISSUE HEAD/NECK
1 series · 14 of 25 positions shown · non-contrast
Comparison: Ultrasound September 15, 2013.

CLINICAL DATA: Goiter.

EXAM:
THYROID ULTRASOUND
TECHNIQUE: Ultrasound examination of the thyroid gland and adjacent soft
tissues was performed.

[Series 1: us soft tissue head/neck · 0.08mm/px · 14 of 35 slices shown]
[im 1/35]
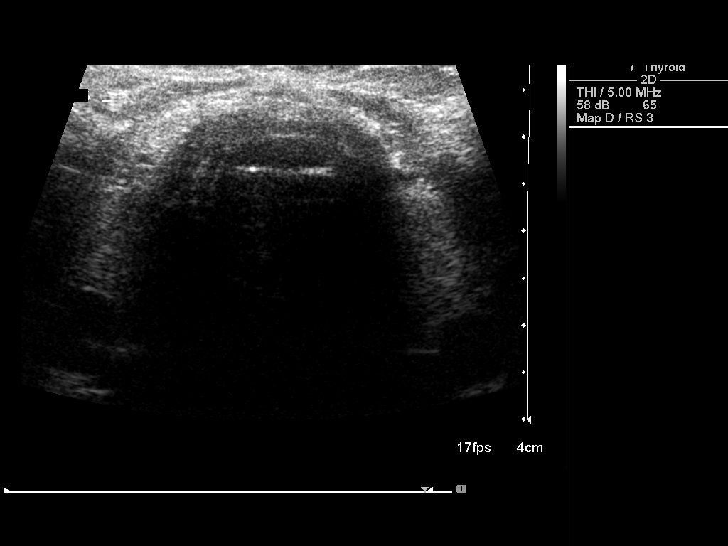
[im 3/35]
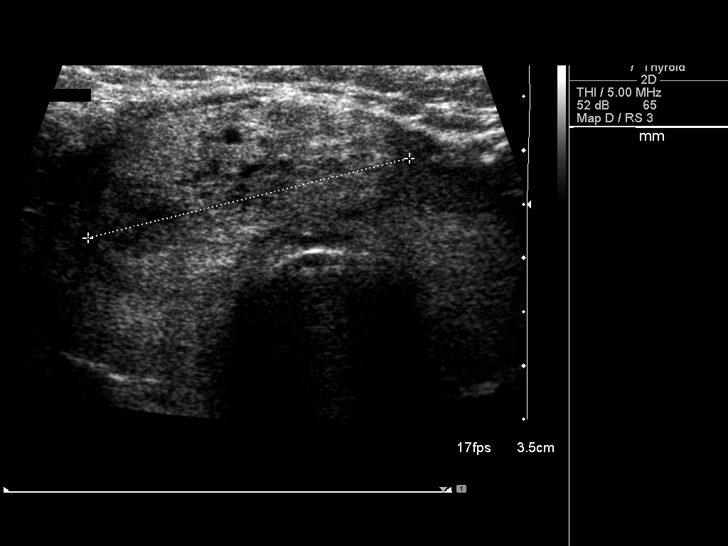
[im 6/35]
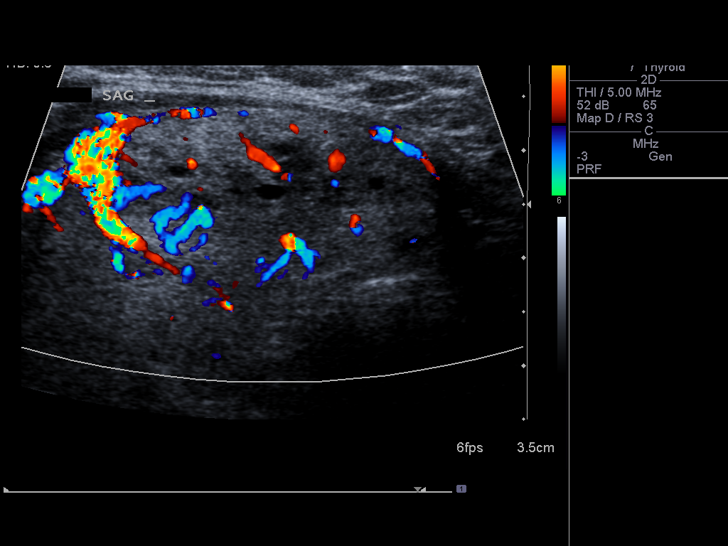
[im 9/35]
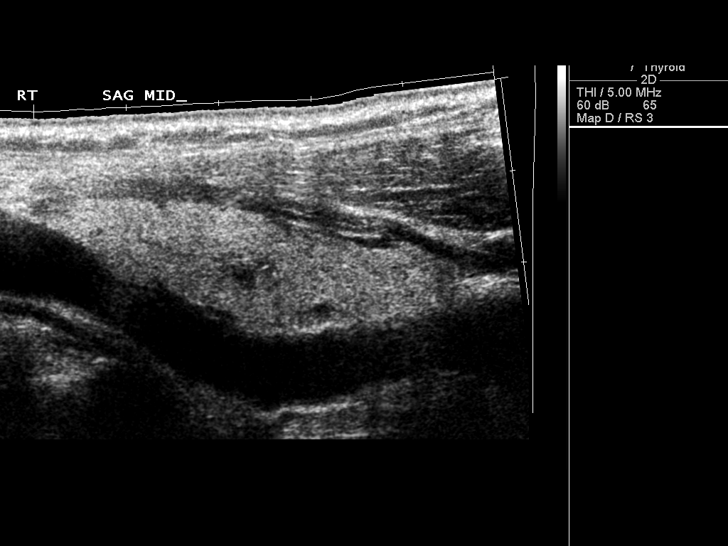
[im 12/35]
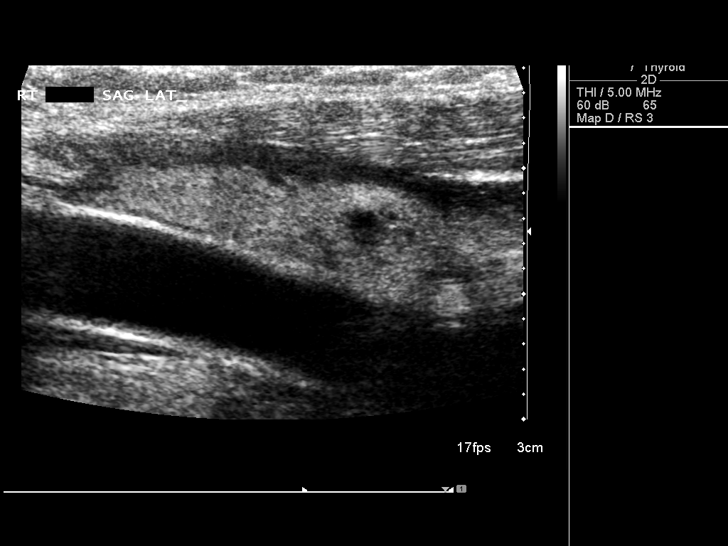
[im 13/35]
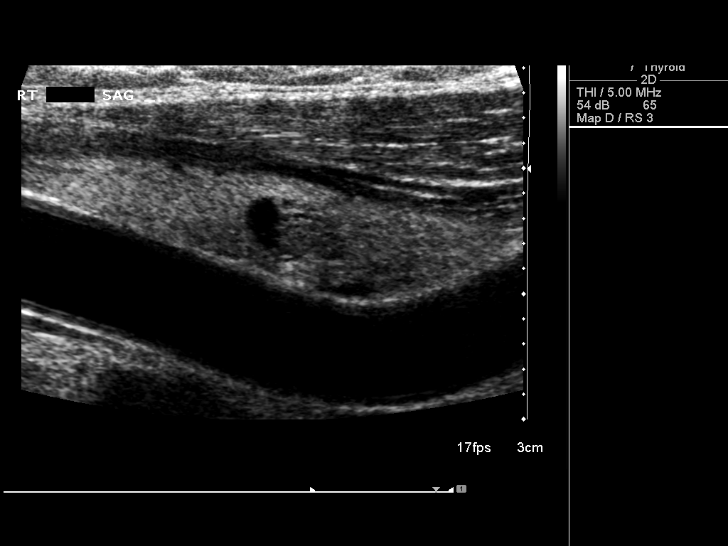
[im 16/35]
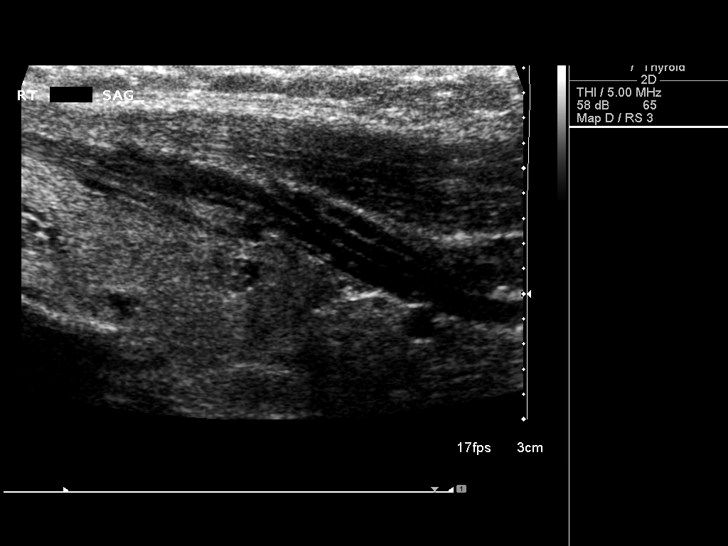
[im 19/35]
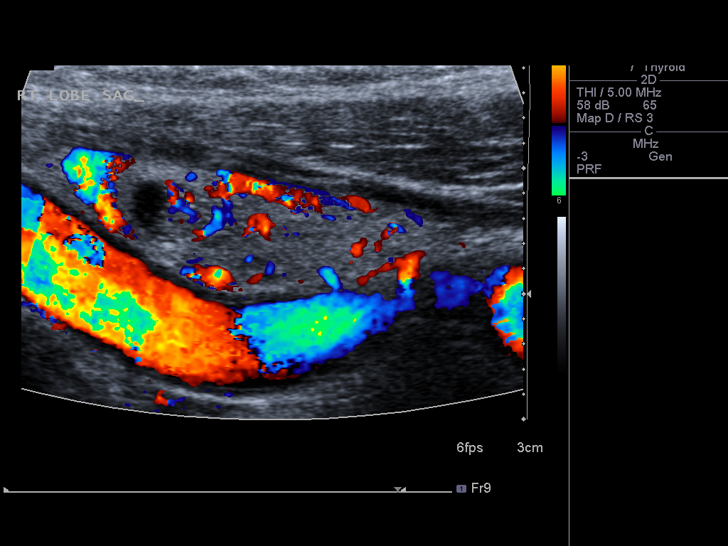
[im 22/35]
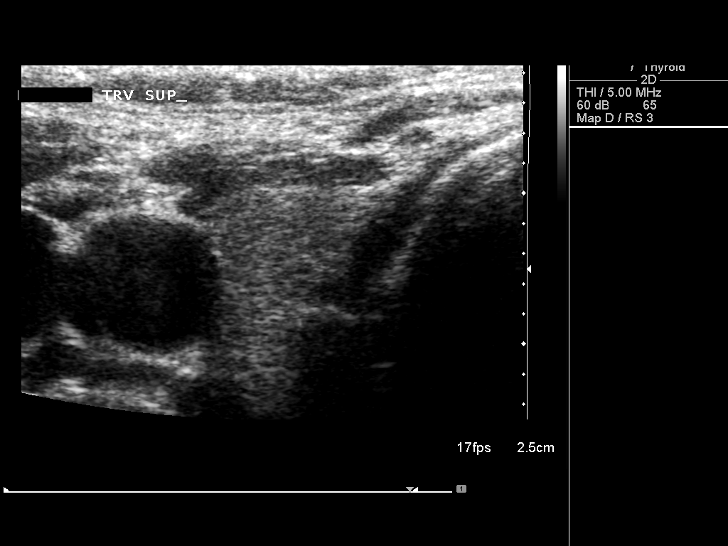
[im 23/35]
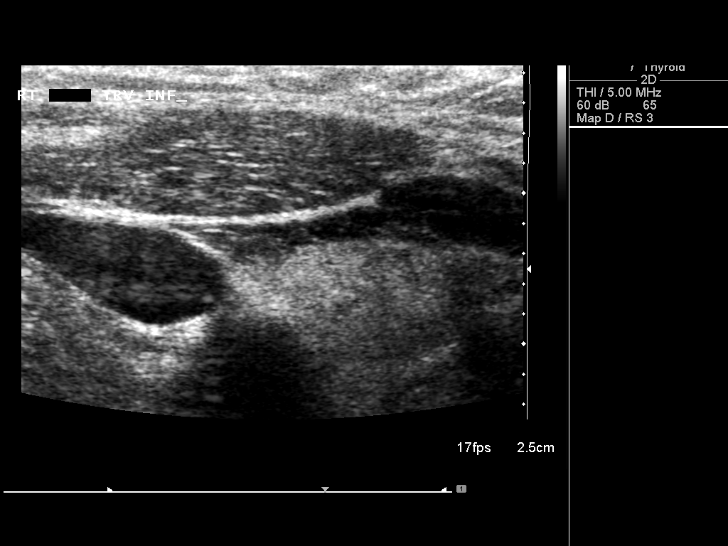
[im 26/35]
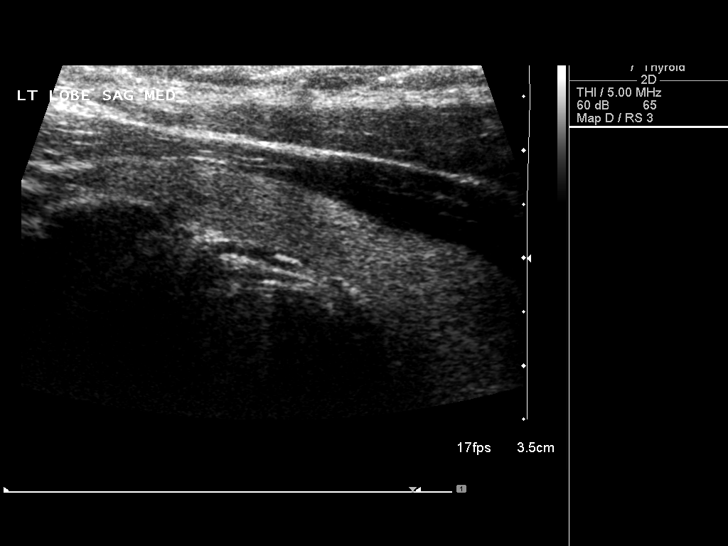
[im 29/35]
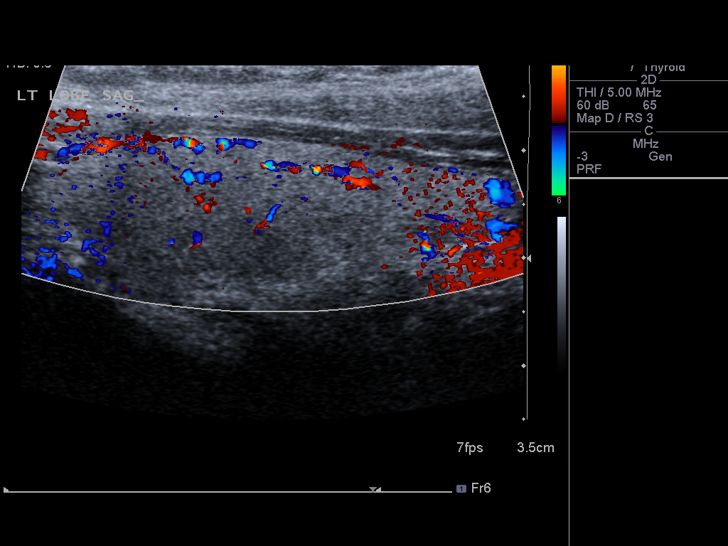
[im 32/35]
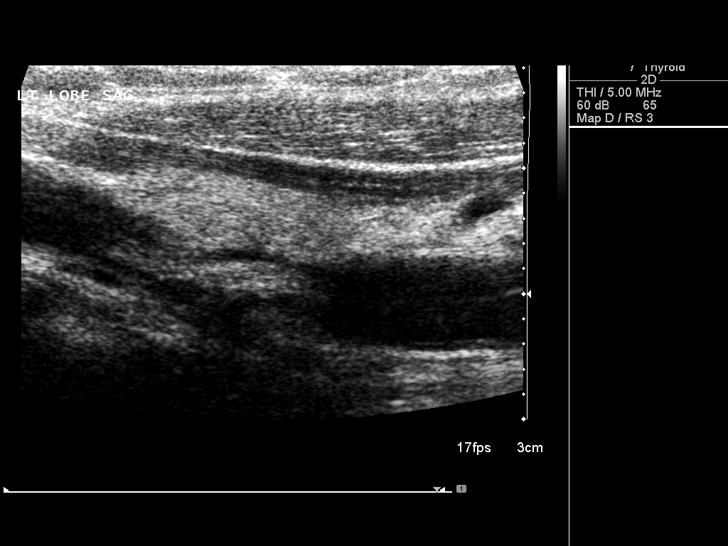
[im 35/35]
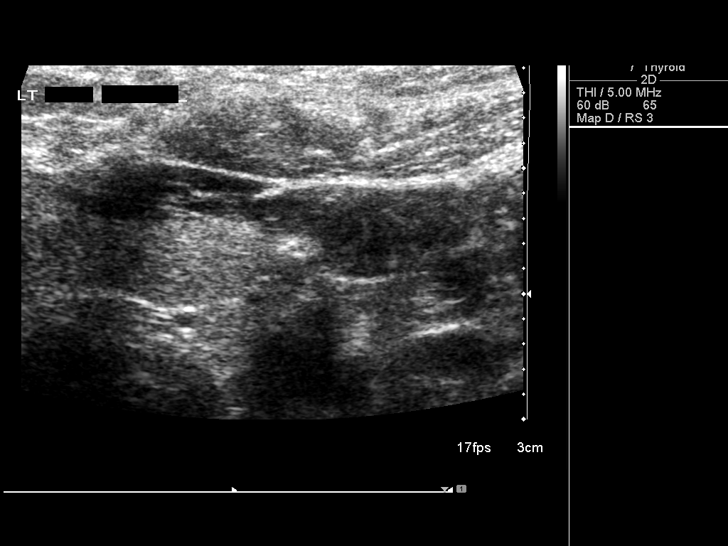

[14 of 25 positions shown; findings below may reference images not displayed]

FINDINGS: Right thyroid lobe

Measurements: 4.7 x 1.5 x 1.3 cm. Two nodules are noted, the largest
being 9 x 5 x 5 mm complex nodule in mid pole. This is not
significantly changed compared to prior exam.

Left thyroid lobe

Measurements: 4.5 x 1.6 x 1.6 cm.  No nodules visualized.

Isthmus

Thickness: 6 mm. Heterogeneous nodule measuring 3.3 x 3.1 x 1.8 cm
is noted which is not significantly changed compared to prior exam.

Lymphadenopathy

None visualized.
IMPRESSION: No significant change seen with 3.3 cm dominant nodule in the
isthmus. Stable small right thyroid nodules.

## 2017-06-11 ENCOUNTER — Encounter: Payer: Self-pay | Admitting: Family Medicine

## 2019-06-02 IMAGING — US US THYROID
1 series · 13 of 25 positions shown · non-contrast
Comparison: 09/15/2014; 09/15/2013; 11/13/2011; 12/12/2010

CLINICAL DATA: Prior ultrasound follow-up. Follow-up thyroid
nodules.

EXAM:
THYROID ULTRASOUND
TECHNIQUE: Ultrasound examination of the thyroid gland and adjacent soft
tissues was performed.

[Series 1: us thyroid · 0.08mm/px · 13 of 44 slices shown]
[im 1/44]
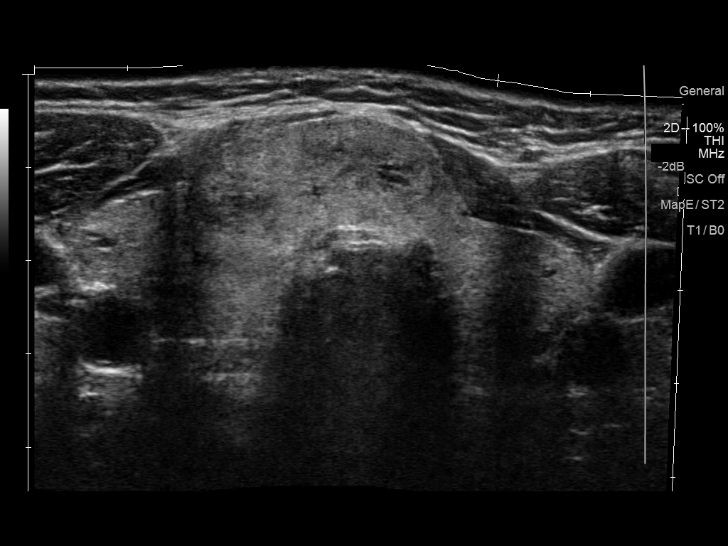
[im 4/44]
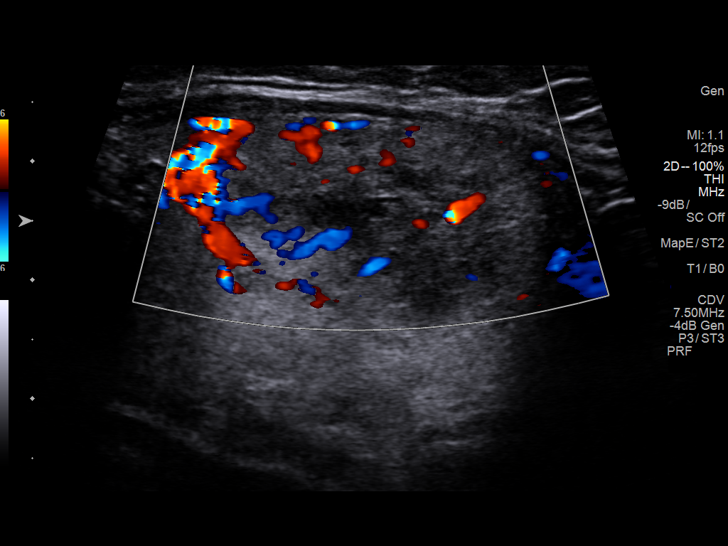
[im 8/44]
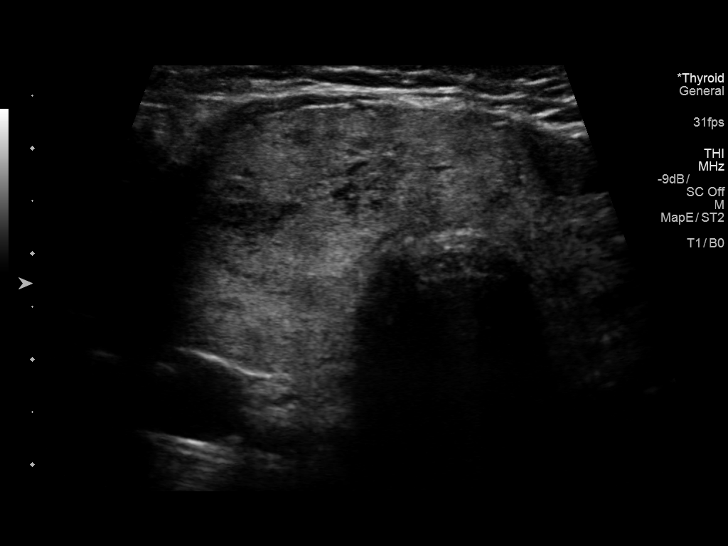
[im 11/44]
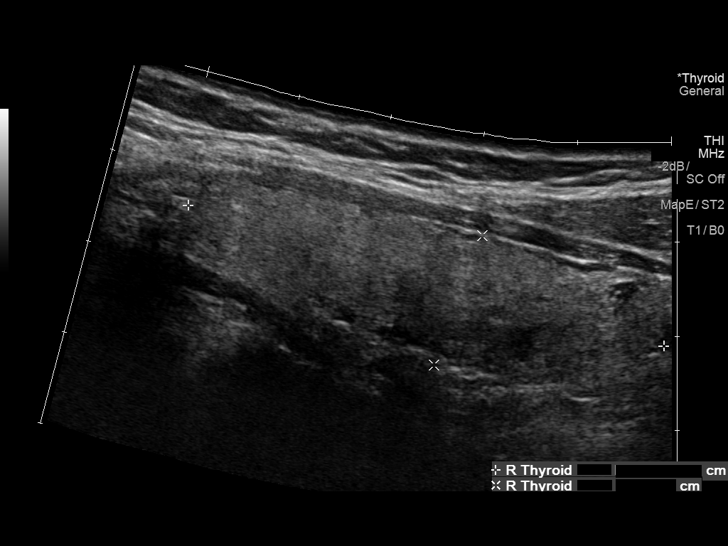
[im 15/44]
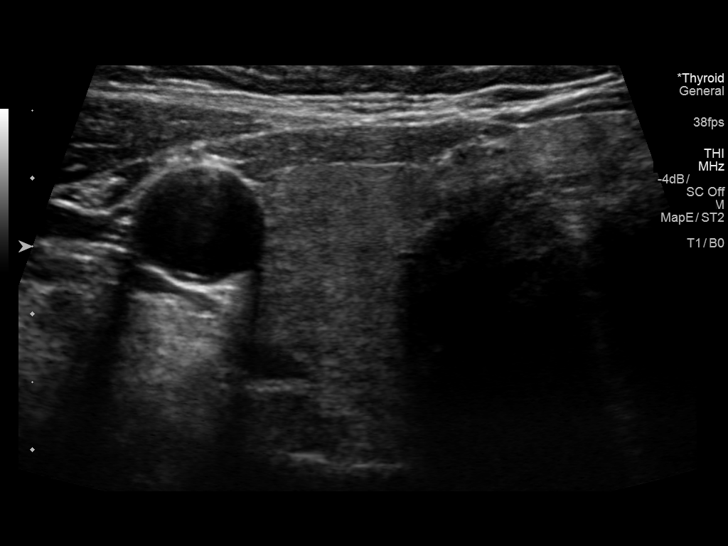
[im 18/44]
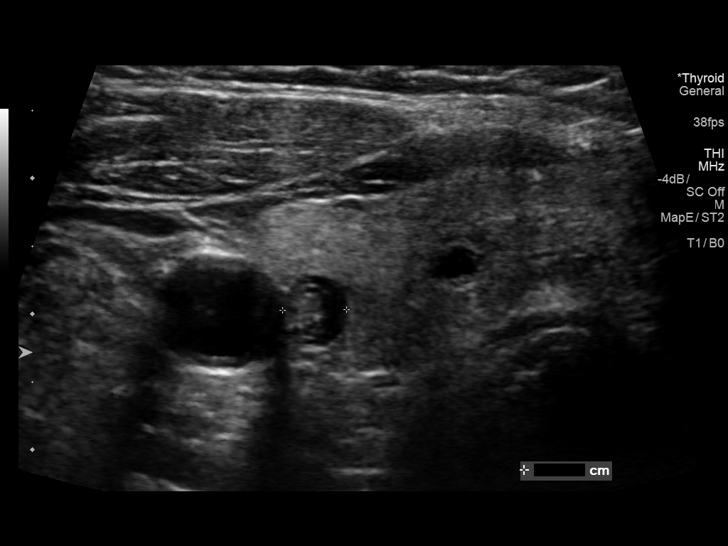
[im 22/44]
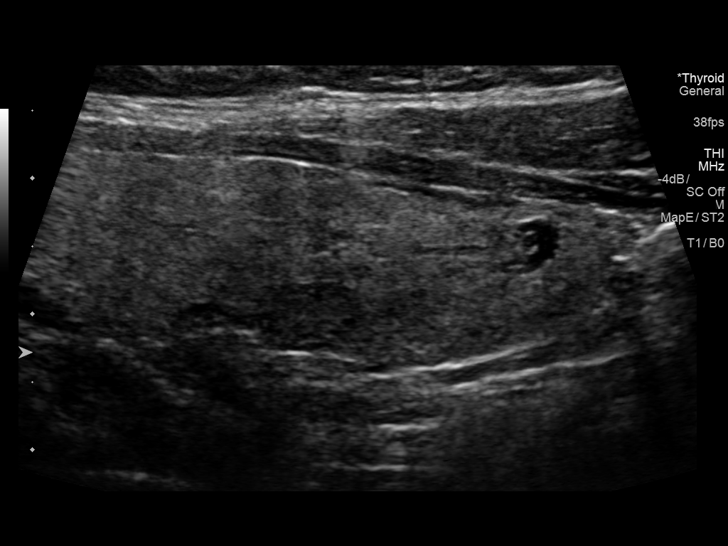
[im 26/44]
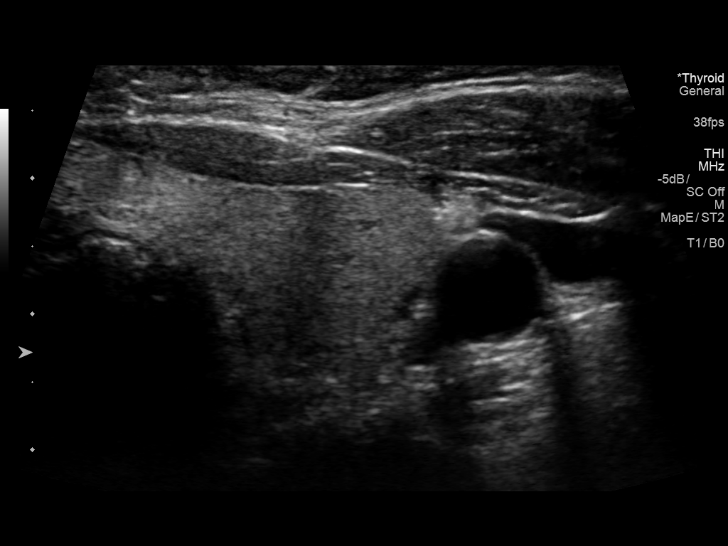
[im 29/44]
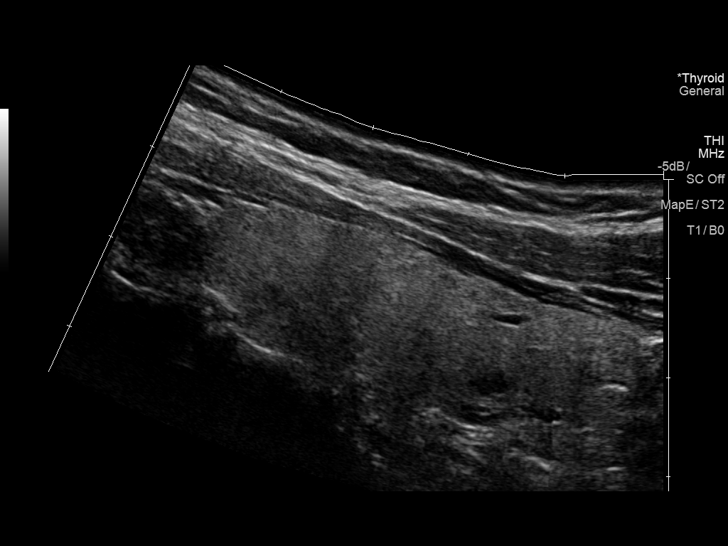
[im 33/44]
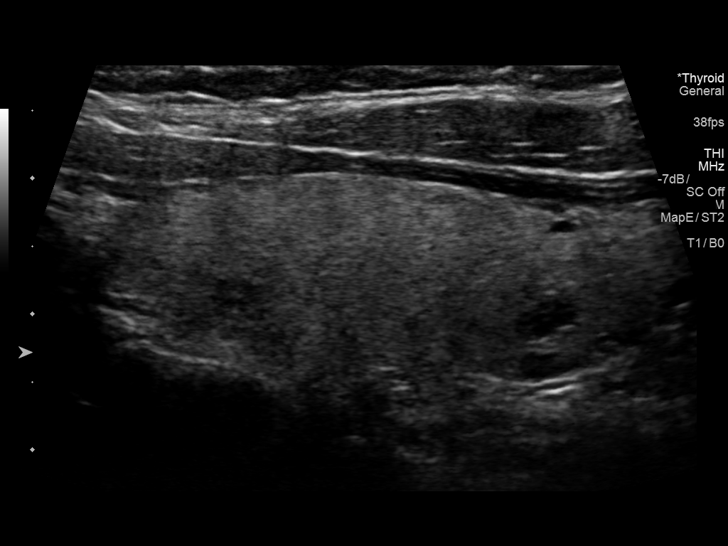
[im 36/44]
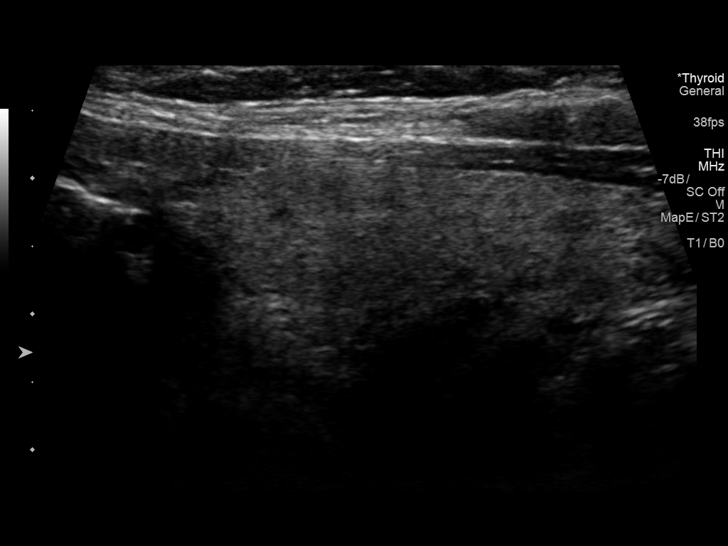
[im 40/44]
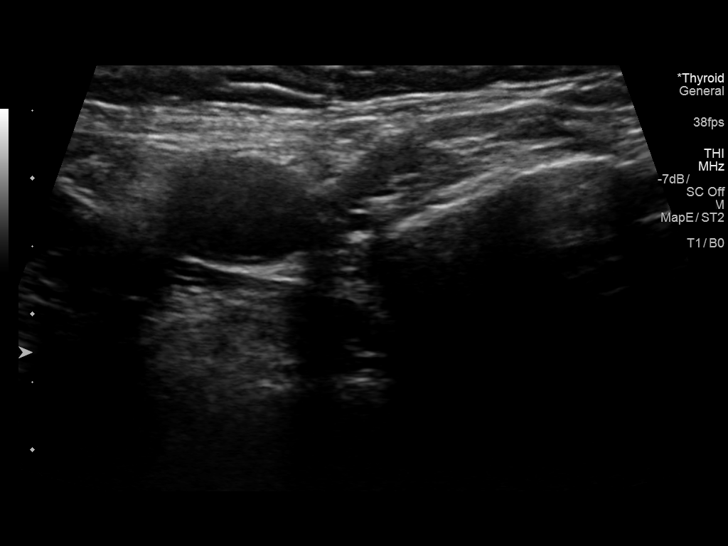
[im 44/44]
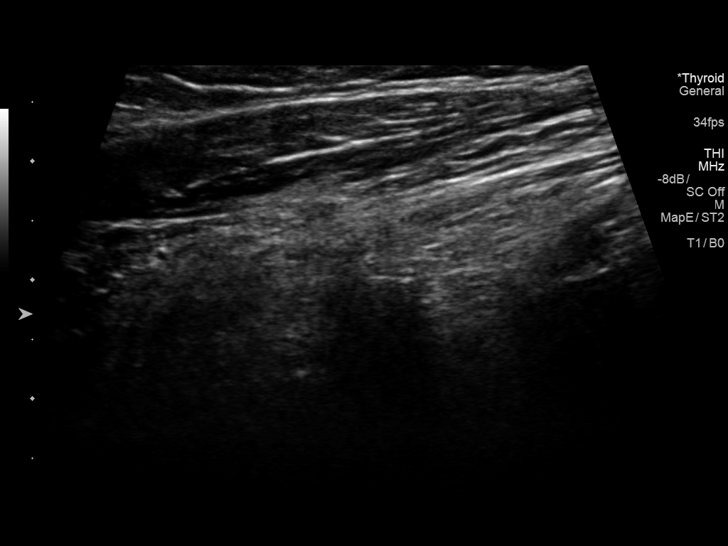

[13 of 25 positions shown; findings below may reference images not displayed]

FINDINGS: Parenchymal Echotexture: Moderately heterogenous

Isthmus: Enlarged measure 1.2 cm in diameter, previously, 0.5 cm

Right lobe: Borderline enlarged measuring 5.3 x 1.5 x 2.0 cm,
unchanged, previously, 4.7 x 1.3 x 1.5 cm

Left lobe: Normal in size measuring 4.8 x 1.6 x 1.8 cm, unchanged,
previously, 4.5 x 1.6 x 1.6 cm

_________________________________________________________

Estimated total number of nodules >/= 1 cm: 1

Number of spongiform nodules >/=  2 cm not described below (TR1): 0

Number of mixed cystic and solid nodules >/= 1.5 cm not described
below (TR2): 0

_________________________________________________________

The approximately 3.1 x 3.0 x 1.7 cm ill-defined nodule/pseudo
nodule within the right-side of the thyroid isthmus appears similar
to the 12/12/2010 examination, previously, 2.8 x 2.9 x 1.6 cm.
Stability for greater than 5 years is indicative of a benign
etiology.

There are 2 punctate (sub 5 mm) mixed cystic and solid nodules
within the inferior pole the right lobe of the thyroid, which
appears similar to the [DATE] examination and neither of which meet
imaging criteria to recommend percutaneous sampling or dedicated
follow-up.
IMPRESSION: 1. Similar findings of multinodular goiter. No new or enlarging
thyroid nodules.
2. The approximately 3.1 cm nodule/pseudo nodule within the
right-side of the thyroid isthmus is grossly unchanged compared to
the [DATE] examination. Stability for greater than 5 years is
indicative of a benign etiology and percutaneous sampling and/or
continued dedicated follow-up is not recommended.
The above is in keeping with the ACR TI-RADS recommendations - [HOSPITAL] 9245;[DATE].

## 2021-09-20 ENCOUNTER — Other Ambulatory Visit (HOSPITAL_COMMUNITY): Payer: Self-pay
